# Patient Record
Sex: Female | Born: 1970
Health system: Southern US, Community
[De-identification: ages and names within clinical notes are randomized; demographics above are authoritative.]

## PROBLEM LIST (undated history)

## (undated) DIAGNOSIS — R519 Headache, unspecified: Secondary | ICD-10-CM

## (undated) DIAGNOSIS — R112 Nausea with vomiting, unspecified: Secondary | ICD-10-CM

## (undated) DIAGNOSIS — IMO0002 Reserved for concepts with insufficient information to code with codable children: Secondary | ICD-10-CM

## (undated) DIAGNOSIS — R87619 Unspecified abnormal cytological findings in specimens from cervix uteri: Secondary | ICD-10-CM

## (undated) DIAGNOSIS — A609 Anogenital herpesviral infection, unspecified: Secondary | ICD-10-CM

## (undated) DIAGNOSIS — I10 Essential (primary) hypertension: Secondary | ICD-10-CM

## (undated) DIAGNOSIS — O24419 Gestational diabetes mellitus in pregnancy, unspecified control: Secondary | ICD-10-CM

## (undated) DIAGNOSIS — Z9889 Other specified postprocedural states: Secondary | ICD-10-CM

## (undated) DIAGNOSIS — O09529 Supervision of elderly multigravida, unspecified trimester: Secondary | ICD-10-CM

## (undated) DIAGNOSIS — F419 Anxiety disorder, unspecified: Secondary | ICD-10-CM

## (undated) DIAGNOSIS — Z8619 Personal history of other infectious and parasitic diseases: Secondary | ICD-10-CM

## (undated) DIAGNOSIS — F32A Depression, unspecified: Secondary | ICD-10-CM

## (undated) HISTORY — DX: Anxiety disorder, unspecified: F41.9

## (undated) HISTORY — DX: Reserved for concepts with insufficient information to code with codable children: IMO0002

## (undated) HISTORY — DX: Gestational diabetes mellitus in pregnancy, unspecified control: O24.419

## (undated) HISTORY — PX: LEEP: SHX91

## (undated) HISTORY — DX: Unspecified abnormal cytological findings in specimens from cervix uteri: R87.619

## (undated) HISTORY — DX: Nausea with vomiting, unspecified: R11.2

## (undated) HISTORY — DX: Anogenital herpesviral infection, unspecified: A60.9

## (undated) HISTORY — DX: Other specified postprocedural states: Z98.890

## (undated) HISTORY — PX: HERNIA REPAIR: SHX51

## (undated) HISTORY — DX: Personal history of other infectious and parasitic diseases: Z86.19

## (undated) HISTORY — DX: Supervision of elderly multigravida, unspecified trimester: O09.529

---
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
## 2011-06-28 ENCOUNTER — Other Ambulatory Visit (HOSPITAL_COMMUNITY): Payer: Self-pay | Admitting: Gynecology

## 2011-06-28 DIAGNOSIS — Z369 Encounter for antenatal screening, unspecified: Secondary | ICD-10-CM

## 2011-07-09 ENCOUNTER — Other Ambulatory Visit (HOSPITAL_COMMUNITY): Payer: Self-pay | Admitting: Gynecology

## 2011-07-09 ENCOUNTER — Ambulatory Visit (HOSPITAL_COMMUNITY)
Admission: RE | Admit: 2011-07-09 | Discharge: 2011-07-09 | Disposition: A | Discharge status: Home / Self Care | Payer: 59 | Source: Ambulatory Visit | Attending: Gynecology | Admitting: Gynecology

## 2011-07-09 DIAGNOSIS — Z36 Encounter for antenatal screening of mother: Secondary | ICD-10-CM | POA: Insufficient documentation

## 2011-07-09 DIAGNOSIS — Z369 Encounter for antenatal screening, unspecified: Secondary | ICD-10-CM

## 2011-07-20 LAB — OB RESULTS CONSOLE ABO/RH

## 2011-07-20 LAB — OB RESULTS CONSOLE ANTIBODY SCREEN: Antibody Screen: NEGATIVE

## 2011-07-20 LAB — OB RESULTS CONSOLE HEPATITIS B SURFACE ANTIGEN: Hepatitis B Surface Ag: NEGATIVE

## 2011-07-20 LAB — OB RESULTS CONSOLE GC/CHLAMYDIA: Gonorrhea: NEGATIVE

## 2011-07-20 LAB — OB RESULTS CONSOLE RUBELLA ANTIBODY, IGM: Rubella: IMMUNE

## 2011-09-20 ENCOUNTER — Other Ambulatory Visit (HOSPITAL_COMMUNITY): Payer: Self-pay | Admitting: General Surgery

## 2011-09-20 ENCOUNTER — Ambulatory Visit (HOSPITAL_COMMUNITY)
Admission: RE | Admit: 2011-09-20 | Discharge: 2011-09-20 | Disposition: A | Discharge status: Home / Self Care | Payer: Self-pay | Source: Ambulatory Visit | Attending: General Surgery | Admitting: General Surgery

## 2011-09-20 DIAGNOSIS — R42 Dizziness and giddiness: Secondary | ICD-10-CM | POA: Insufficient documentation

## 2011-09-20 DIAGNOSIS — O99891 Other specified diseases and conditions complicating pregnancy: Secondary | ICD-10-CM | POA: Insufficient documentation

## 2011-09-20 DIAGNOSIS — G459 Transient cerebral ischemic attack, unspecified: Secondary | ICD-10-CM

## 2011-09-20 DIAGNOSIS — Z3490 Encounter for supervision of normal pregnancy, unspecified, unspecified trimester: Secondary | ICD-10-CM

## 2011-12-08 ENCOUNTER — Encounter: Payer: 59 | Attending: Obstetrics and Gynecology | Admitting: *Deleted

## 2011-12-08 VITALS — Ht 63.0 in | Wt 129.6 lb

## 2011-12-08 DIAGNOSIS — Z713 Dietary counseling and surveillance: Secondary | ICD-10-CM | POA: Insufficient documentation

## 2011-12-08 DIAGNOSIS — O99810 Abnormal glucose complicating pregnancy: Secondary | ICD-10-CM | POA: Insufficient documentation

## 2011-12-09 ENCOUNTER — Encounter: Payer: Self-pay | Admitting: *Deleted

## 2011-12-09 NOTE — Progress Notes (Signed)
  Patient was seen on 12/08/11 for Gestational Diabetes self-management class at the [facility]. The following learning objectives were met by the patient during this course:   States the definition of Gestational Diabetes  States why dietary management is important in controlling blood glucose  Describes the effects each nutrient has on blood glucose levels  Demonstrates ability to create a balanced meal plan  Demonstrates carbohydrate counting   States when to check blood glucose levels  Demonstrates proper blood glucose monitoring techniques  States the effect of stress and exercise on blood glucose levels  States the importance of limiting caffeine and abstaining from alcohol and smoking  Blood glucose monitor given:  One Touch Ultra Mini Self Monitoring Kit Lot # [id number] X Exp: 4/14 Blood glucose reading: 55 mg/dl  Patient instructed to monitor glucose levels: FBS: 60 - <90 2 hour: <120  *Patient received handouts:  Nutrition Diabetes and Pregnancy  Carbohydrate Counting List  Patient will be seen for follow-up as needed.

## 2011-12-09 NOTE — Patient Instructions (Signed)
Goals:  Check glucose levels per MD as instructed  Follow Gestational Diabetes Diet as instructed  Call for follow-up as needed    

## 2012-02-23 ENCOUNTER — Encounter (HOSPITAL_COMMUNITY): Payer: Self-pay | Admitting: *Deleted

## 2012-02-23 ENCOUNTER — Telehealth (HOSPITAL_COMMUNITY): Payer: Self-pay | Admitting: *Deleted

## 2012-02-23 NOTE — Telephone Encounter (Signed)
Preadmission screen  

## 2012-02-26 ENCOUNTER — Encounter (HOSPITAL_COMMUNITY): Payer: Self-pay | Admitting: Anesthesiology

## 2012-02-26 ENCOUNTER — Inpatient Hospital Stay (HOSPITAL_COMMUNITY)
Admission: AD | Admit: 2012-02-26 | Discharge: 2012-03-01 | DRG: 765 | Disposition: A | Discharge status: Home / Self Care | Payer: 59 | Source: Ambulatory Visit | Attending: Obstetrics and Gynecology | Admitting: Obstetrics and Gynecology

## 2012-02-26 ENCOUNTER — Encounter (HOSPITAL_COMMUNITY): Payer: Self-pay | Admitting: *Deleted

## 2012-02-26 DIAGNOSIS — O09519 Supervision of elderly primigravida, unspecified trimester: Secondary | ICD-10-CM | POA: Diagnosis present

## 2012-02-26 DIAGNOSIS — E119 Type 2 diabetes mellitus without complications: Secondary | ICD-10-CM | POA: Diagnosis present

## 2012-02-26 DIAGNOSIS — IMO0002 Reserved for concepts with insufficient information to code with codable children: Secondary | ICD-10-CM | POA: Diagnosis not present

## 2012-02-26 DIAGNOSIS — O324XX0 Maternal care for high head at term, not applicable or unspecified: Secondary | ICD-10-CM | POA: Diagnosis present

## 2012-02-26 DIAGNOSIS — O2432 Unspecified pre-existing diabetes mellitus in childbirth: Secondary | ICD-10-CM | POA: Diagnosis present

## 2012-02-26 LAB — CBC
HCT: 42.6 % (ref 36.0–46.0)
MCHC: 34.7 g/dL (ref 30.0–36.0)
MCHC: 34 g/dL (ref 30.0–36.0)
MCV: 91.6 fL (ref 78.0–100.0)
MCV: 93.1 fL (ref 78.0–100.0)
Platelets: 167 10*3/uL (ref 150–400)
RDW: 13.7 % (ref 11.5–15.5)
RDW: 14 % (ref 11.5–15.5)
WBC: 19.3 10*3/uL — ABNORMAL HIGH (ref 4.0–10.5)

## 2012-02-26 LAB — COMPREHENSIVE METABOLIC PANEL
AST: 68 U/L — ABNORMAL HIGH (ref 0–37)
Albumin: 2.5 g/dL — ABNORMAL LOW (ref 3.5–5.2)
Chloride: 101 mEq/L (ref 96–112)
Creatinine, Ser: 1.03 mg/dL (ref 0.50–1.10)
Total Bilirubin: 0.6 mg/dL (ref 0.3–1.2)
Total Protein: 5.6 g/dL — ABNORMAL LOW (ref 6.0–8.3)

## 2012-02-26 LAB — URINALYSIS, DIPSTICK ONLY
Bilirubin Urine: NEGATIVE
Glucose, UA: NEGATIVE mg/dL
Ketones, ur: NEGATIVE mg/dL
Nitrite: NEGATIVE
Protein, ur: 100 mg/dL — AB
Specific Gravity, Urine: 1.02 (ref 1.005–1.030)
Urobilinogen, UA: 0.2 mg/dL (ref 0.0–1.0)
pH: 6 (ref 5.0–8.0)

## 2012-02-26 LAB — GLUCOSE, CAPILLARY: Glucose-Capillary: 66 mg/dL — ABNORMAL LOW (ref 70–99)

## 2012-02-26 LAB — LACTATE DEHYDROGENASE: LDH: 234 U/L (ref 94–250)

## 2012-02-26 MED ORDER — PHENYLEPHRINE 40 MCG/ML (10ML) SYRINGE FOR IV PUSH (FOR BLOOD PRESSURE SUPPORT): 80.0000 ug | PREFILLED_SYRINGE | INTRAVENOUS | Status: DC | PRN

## 2012-02-26 MED ORDER — FLEET ENEMA 7-19 GM/118ML RE ENEM: 1.0000 | ENEMA | RECTAL | Status: DC | PRN

## 2012-02-26 MED ORDER — ACETAMINOPHEN 325 MG PO TABS: 650.0000 mg | ORAL_TABLET | ORAL | Status: DC | PRN

## 2012-02-26 MED ORDER — EPHEDRINE 5 MG/ML INJ: 10.0000 mg | INTRAVENOUS | Status: DC | PRN

## 2012-02-26 MED ORDER — OXYTOCIN 40 UNITS IN LACTATED RINGERS INFUSION - SIMPLE MED: 62.5000 mL/h | INTRAVENOUS | Status: DC

## 2012-02-26 MED ORDER — LACTATED RINGERS IV SOLN: 500.0000 mL | INTRAVENOUS | Status: DC | PRN

## 2012-02-26 MED ORDER — DIPHENHYDRAMINE HCL 50 MG/ML IJ SOLN: 12.5000 mg | INTRAMUSCULAR | Status: DC | PRN

## 2012-02-26 MED ORDER — IBUPROFEN 600 MG PO TABS: 600.0000 mg | ORAL_TABLET | Freq: Four times a day (QID) | ORAL | Status: DC | PRN

## 2012-02-26 MED ORDER — CITRIC ACID-SODIUM CITRATE 334-500 MG/5ML PO SOLN
30.0000 mL | ORAL | Status: DC | PRN
  Administered 2012-02-27 (×2): 30 mL via ORAL
  Filled 2012-02-26 (×2): qty 15

## 2012-02-26 MED ORDER — LACTATED RINGERS IV SOLN
500.0000 mL | Freq: Once | INTRAVENOUS | Status: AC
  Administered 2012-02-26: 500 mL via INTRAVENOUS

## 2012-02-26 MED ORDER — OXYCODONE-ACETAMINOPHEN 5-325 MG PO TABS: 1.0000 | ORAL_TABLET | ORAL | Status: DC | PRN

## 2012-02-26 MED ORDER — ONDANSETRON HCL 4 MG/2ML IJ SOLN: 4.0000 mg | Freq: Four times a day (QID) | INTRAMUSCULAR | Status: DC | PRN

## 2012-02-26 MED ORDER — OXYTOCIN 40 UNITS IN LACTATED RINGERS INFUSION - SIMPLE MED
1.0000 m[IU]/min | INTRAVENOUS | Status: DC
  Administered 2012-02-27: 10 m[IU]/min via INTRAVENOUS
  Administered 2012-02-27: 2 m[IU]/min via INTRAVENOUS
  Filled 2012-02-26: qty 1000

## 2012-02-26 MED ORDER — OXYTOCIN BOLUS FROM INFUSION: 500.0000 mL | INTRAVENOUS | Status: DC

## 2012-02-26 MED ORDER — PHENYLEPHRINE 40 MCG/ML (10ML) SYRINGE FOR IV PUSH (FOR BLOOD PRESSURE SUPPORT)
80.0000 ug | PREFILLED_SYRINGE | INTRAVENOUS | Status: DC | PRN
  Filled 2012-02-26: qty 5

## 2012-02-26 MED ORDER — FENTANYL 2.5 MCG/ML BUPIVACAINE 1/10 % EPIDURAL INFUSION (WH - ANES)
14.0000 mL/h | INTRAMUSCULAR | Status: DC
  Administered 2012-02-26 – 2012-02-27 (×4): 14 mL/h via EPIDURAL
  Filled 2012-02-26 (×4): qty 125

## 2012-02-26 MED ORDER — TERBUTALINE SULFATE 1 MG/ML IJ SOLN: 0.2500 mg | Freq: Once | INTRAMUSCULAR | Status: AC | PRN

## 2012-02-26 MED ORDER — EPHEDRINE 5 MG/ML INJ
10.0000 mg | INTRAVENOUS | Status: DC | PRN
  Filled 2012-02-26: qty 4

## 2012-02-26 MED ORDER — LIDOCAINE HCL (PF) 1 % IJ SOLN
INTRAMUSCULAR | Status: DC | PRN
  Administered 2012-02-26: 9 mL
  Administered 2012-02-26: 7 mL

## 2012-02-26 MED ORDER — LIDOCAINE HCL (PF) 1 % IJ SOLN: 30.0000 mL | INTRAMUSCULAR | Status: DC | PRN

## 2012-02-26 MED ORDER — LACTATED RINGERS IV SOLN
INTRAVENOUS | Status: DC
  Administered 2012-02-26: 125 mL/h via INTRAVENOUS
  Administered 2012-02-26 – 2012-02-27 (×2): via INTRAVENOUS
  Administered 2012-02-27: 112 mL/h via INTRAVENOUS

## 2012-02-26 NOTE — Anesthesia Procedure Notes (Signed)
Epidural Patient location during procedure: OB Start time: 02/26/2012 10:54 AM End time: 02/26/2012 10:58 AM  Staffing Anesthesiologist: Sandrea Hughs Performed by: anesthesiologist   Preanesthetic Checklist Completed: patient identified, site marked, surgical consent, pre-op evaluation, timeout performed, IV checked, risks and benefits discussed and monitors and equipment checked  Epidural Patient position: sitting Prep: site prepped and draped and DuraPrep Patient monitoring: continuous pulse ox and blood pressure Approach: midline Injection technique: LOR air  Needle:  Needle type: Tuohy  Needle gauge: 17 G Needle length: 9 cm and 9 Needle insertion depth: 5 cm cm Catheter type: closed end flexible Catheter size: 19 Gauge Catheter at skin depth: 10 cm Test dose: negative  Assessment Events: blood not aspirated, injection not painful, no injection resistance, negative IV test and no paresthesia  Additional Notes Reason for block:procedure for pain

## 2012-02-26 NOTE — Progress Notes (Signed)
Dr Langston Masker called and given report on pt. VE, FHR pattern, contraction pattern, orders received

## 2012-02-26 NOTE — Progress Notes (Signed)
Kniyah Khun is a 41 y.o. G1P0 at [redacted]w[redacted]d by ultrasound admitted for active labor  Subjective:   Objective: BP 153/84  Pulse 69  Temp 98.4 F (36.9 C) (Oral)  Resp 20  Ht 5\' 3"  (1.6 m)  Wt 145 lb (65.772 kg)  BMI 25.69 kg/m2  SpO2 99%  LMP 06/04/2011      FHT:  FHR: 135 bpm, variability: moderate,  accelerations:  Present,  decelerations:  Absent UC:   regular, every 2-3 minutes SVE:   Dilation: 5 Effacement (%): 90 Station: 0 Exam by:: Dr. Langston Masker  Labs: Lab Results  Component Value Date   WBC 13.9* 02/26/2012   HGB 14.8 02/26/2012   HCT 42.6 02/26/2012   MCV 91.6 02/26/2012   PLT 181 02/26/2012    Assessment / Plan: Spontaneous labor, progressing normally  Labor: Augmented with AROM Preeclampsia:  n/a Fetal Wellbeing:  Category I Pain Control:  Epidural I/D:  n/a Anticipated MOD:  NSVD  Urania Pearlman 02/26/2012, 8:34 PM

## 2012-02-26 NOTE — Anesthesia Preprocedure Evaluation (Addendum)
Anesthesia Evaluation  Patient identified by MRN, date of birth, ID band Patient awake    Reviewed: Allergy & Precautions, H&P , NPO status , Patient's Chart, lab work & pertinent test results  History of Anesthesia Complications (+) PONV  Airway Mallampati: II TM Distance: >3 FB Neck ROM: full    Dental No notable dental hx.    Pulmonary neg pulmonary ROS,    Pulmonary exam normal       Cardiovascular hypertension (on magnesium for PIH), negative cardio ROS      Neuro/Psych negative neurological ROS     GI/Hepatic negative GI ROS, Neg liver ROS,   Endo/Other  diabetes, Gestational  Renal/GU negative Renal ROS  negative genitourinary   Musculoskeletal negative musculoskeletal ROS (+)   Abdominal Normal abdominal exam  (+)   Peds negative pediatric ROS (+)  Hematology negative hematology ROS (+)   Anesthesia Other Findings   Reproductive/Obstetrics (+) Pregnancy (c/s for arrest of descent)                          Anesthesia Physical Anesthesia Plan  ASA: III and emergent  Anesthesia Plan: Epidural   Post-op Pain Management:    Induction:   Airway Management Planned:   Additional Equipment:   Intra-op Plan:   Post-operative Plan:   Informed Consent: I have reviewed the patients History and Physical, chart, labs and discussed the procedure including the risks, benefits and alternatives for the proposed anesthesia with the patient or authorized representative who has indicated his/her understanding and acceptance.     Plan Discussed with: Surgeon and CRNA  Anesthesia Plan Comments:        Anesthesia Quick Evaluation

## 2012-02-26 NOTE — Progress Notes (Signed)
Comfortable with epidural VSS.   Toco: q 5-7 FHT: 130.  Mod var.  +accel.  No decel. SVE: 2/90/-2, AROM, clear 41yo G1 at [redacted]w[redacted]d with labor -Augment with AROM -Add pitocin prn  Mitchel Honour, DO

## 2012-02-26 NOTE — MAU Note (Signed)
Contractions all night which have gotten closer and stronger

## 2012-02-26 NOTE — H&P (Signed)
Donna Phillips is a 41 y.o. female presenting for labor.  CTX started last night and have increased in frequency and intensity.  +FM.  No VB.  No LOF.  Denies HSV prodromal s/sx.  Has been taking Acyclovir suppression. Maternal Medical History:  Reason for admission: Reason for admission: contractions.  Contractions: Onset was 13-24 hours ago.   Frequency: regular.   Perceived severity is moderate.    Fetal activity: Perceived fetal activity is normal.   Last perceived fetal movement was within the past hour.    Prenatal complications: no prenatal complications Prenatal Complications - Diabetes: gestational. Diabetes is managed by diet.      OB History    Grav Para Term Preterm Abortions TAB SAB Ect Mult Living   1              Past Medical History  Diagnosis Date  . Diabetes mellitus   . AMA (advanced maternal age) multigravida 35+   . Abnormal Pap smear   . HSV (herpes simplex virus) anogenital infection   . H/O varicella   . Anxiety   . Gestational diabetes     diet controlled  . PONV (postoperative nausea and vomiting)    Past Surgical History  Procedure Date  . Tonsillectomy 1993  . Hernia repair 1974  . Leep    Family History: family history includes Diabetes in her maternal grandfather; Heart attack in her father, paternal grandfather, and paternal grandmother; Heart disease in her maternal grandfather and paternal uncle; and Stroke in her paternal grandfather. Social History:  does not have a smoking history on file. She has never used smokeless tobacco. She reports that she does not drink alcohol or use illicit drugs.   Prenatal Transfer Tool  Maternal Diabetes: Yes:  Diabetes Type:  Diet controlled Genetic Screening: Normal Maternal Ultrasounds/Referrals: Normal Fetal Ultrasounds or other Referrals:  None Maternal Substance Abuse:  No Significant Maternal Medications:  Meds include: Other: Acyclovir Significant Maternal Lab Results:  None Other Comments:   None  ROS  Dilation: 2 Effacement (%): 80 Station: -2 Exam by:: Ginger Layia Walla RN Blood pressure 159/91, pulse 81, temperature 97.7 F (36.5 C), temperature source Oral, resp. rate 20, height 5\' 3"  (1.6 m), weight 145 lb (65.772 kg), last menstrual period 06/04/2011. Maternal Exam:  Uterine Assessment: Contraction strength is moderate.  Contraction frequency is regular.   Abdomen: Patient reports no abdominal tenderness. Fundal height is  c/w dates.   Estimated fetal weight is 7#8.   Fetal presentation: vertex  Introitus: Normal vulva. Normal vagina.  Pelvis: adequate for delivery.   Cervix: Cervix evaluated by digital exam.     Physical Exam  Constitutional: She is oriented to person, place, and time. She appears well-developed and well-nourished.  Neck: Normal range of motion. Neck supple.  GI: Soft. Bowel sounds are normal.  Genitourinary: Vagina normal and uterus normal.  Musculoskeletal: Normal range of motion.  Neurological: She is alert and oriented to person, place, and time.  Skin: Skin is warm and dry.  Psychiatric: She has a normal mood and affect. Her behavior is normal.    Prenatal labs: ABO, Rh: O/Positive/-- (04/23 0000) Antibody: Negative (04/23 0000) Rubella: Immune (04/23 0000) RPR: Nonreactive (04/23 0000)  HBsAg: Negative (04/23 0000)  HIV: Non-reactive (04/23 0000)  GBS: Negative (11/01 0000)   Assessment/Plan: 40yo G1 at [redacted]w[redacted]d with latent labor -Will get comfortable with epidural -Perform SSE for HSV lesions -Augment with pitocin prn -GBS neg -A1DM-will monitor blood sugar q 4 hours  Kinley Ferrentino 02/26/2012, 10:49 AM

## 2012-02-27 ENCOUNTER — Inpatient Hospital Stay (HOSPITAL_COMMUNITY): Payer: 59 | Admitting: Anesthesiology

## 2012-02-27 ENCOUNTER — Encounter (HOSPITAL_COMMUNITY): Payer: Self-pay | Admitting: *Deleted

## 2012-02-27 ENCOUNTER — Encounter (HOSPITAL_COMMUNITY)
Admission: AD | Disposition: A | Discharge status: Home / Self Care | Payer: Self-pay | Source: Ambulatory Visit | Attending: Obstetrics and Gynecology

## 2012-02-27 ENCOUNTER — Encounter (HOSPITAL_COMMUNITY): Payer: Self-pay | Admitting: Anesthesiology

## 2012-02-27 LAB — CBC
HCT: 37.4 % (ref 36.0–46.0)
Hemoglobin: 14.7 g/dL (ref 12.0–15.0)
Hemoglobin: 12.7 g/dL (ref 12.0–15.0)
MCH: 31.9 pg (ref 26.0–34.0)
MCH: 31.2 pg (ref 26.0–34.0)
MCHC: 34.3 g/dL (ref 30.0–36.0)
MCV: 91.9 fL (ref 78.0–100.0)
Platelets: 154 10*3/uL (ref 150–400)
RBC: 4.07 MIL/uL (ref 3.87–5.11)
RDW: 14.2 % (ref 11.5–15.5)
WBC: 24.6 10*3/uL — ABNORMAL HIGH (ref 4.0–10.5)

## 2012-02-27 LAB — URIC ACID: Uric Acid, Serum: 6.4 mg/dL (ref 2.4–7.0)

## 2012-02-27 LAB — COMPREHENSIVE METABOLIC PANEL
ALT: 69 U/L — ABNORMAL HIGH (ref 0–35)
Albumin: 2.4 g/dL — ABNORMAL LOW (ref 3.5–5.2)
Albumin: 2 g/dL — ABNORMAL LOW (ref 3.5–5.2)
Alkaline Phosphatase: 145 U/L — ABNORMAL HIGH (ref 39–117)
BUN: 17 mg/dL (ref 6–23)
BUN: 17 mg/dL (ref 6–23)
Calcium: 8.6 mg/dL (ref 8.4–10.5)
Creatinine, Ser: 1.27 mg/dL — ABNORMAL HIGH (ref 0.50–1.10)
GFR calc Af Amer: 52 mL/min — ABNORMAL LOW (ref >90)
GFR calc Af Amer: 61 mL/min — ABNORMAL LOW (ref >90)
Glucose, Bld: 114 mg/dL — ABNORMAL HIGH (ref 70–99)
Glucose, Bld: 152 mg/dL — ABNORMAL HIGH (ref 70–99)
Potassium: 3.9 mEq/L (ref 3.5–5.1)
Sodium: 130 mEq/L — ABNORMAL LOW (ref 135–145)
Total Protein: 6 g/dL (ref 6.0–8.3)
Total Protein: 5.1 g/dL — ABNORMAL LOW (ref 6.0–8.3)

## 2012-02-27 LAB — GLUCOSE, CAPILLARY: Glucose-Capillary: 69 mg/dL — ABNORMAL LOW (ref 70–99)

## 2012-02-27 SURGERY — Surgical Case
Anesthesia: Epidural | Site: Abdomen | Wound class: Clean Contaminated

## 2012-02-27 MED ORDER — MAGNESIUM SULFATE 40 G IN LACTATED RINGERS - SIMPLE
1.0000 g/h | INTRAVENOUS | Status: AC
  Administered 2012-02-28: 1 g/h via INTRAVENOUS
  Filled 2012-02-27 (×2): qty 500

## 2012-02-27 MED ORDER — SCOPOLAMINE 1 MG/3DAYS TD PT72
MEDICATED_PATCH | TRANSDERMAL | Status: AC
  Administered 2012-02-27: 1.5 mg via TRANSDERMAL
  Filled 2012-02-27: qty 1

## 2012-02-27 MED ORDER — CARBOPROST TROMETHAMINE 250 MCG/ML IM SOLN
INTRAMUSCULAR | Status: DC | PRN
  Administered 2012-02-27: 250 ug via INTRAMUSCULAR

## 2012-02-27 MED ORDER — SODIUM CHLORIDE 0.9 % IJ SOLN: 3.0000 mL | INTRAMUSCULAR | Status: DC | PRN

## 2012-02-27 MED ORDER — SODIUM CHLORIDE 0.9 % IR SOLN
Status: DC | PRN
  Administered 2012-02-27: 1000 mL

## 2012-02-27 MED ORDER — WITCH HAZEL-GLYCERIN EX PADS: 1.0000 "application " | MEDICATED_PAD | CUTANEOUS | Status: DC | PRN

## 2012-02-27 MED ORDER — DIPHENHYDRAMINE HCL 50 MG/ML IJ SOLN: 25.0000 mg | INTRAMUSCULAR | Status: DC | PRN

## 2012-02-27 MED ORDER — OXYTOCIN 10 UNIT/ML IJ SOLN
40.0000 [IU] | INTRAVENOUS | Status: DC | PRN
  Administered 2012-02-27: 40 [IU] via INTRAVENOUS

## 2012-02-27 MED ORDER — KETOROLAC TROMETHAMINE 60 MG/2ML IM SOLN: 60.0000 mg | Freq: Once | INTRAMUSCULAR | Status: AC | PRN

## 2012-02-27 MED ORDER — DIPHENHYDRAMINE HCL 25 MG PO CAPS: 25.0000 mg | ORAL_CAPSULE | Freq: Four times a day (QID) | ORAL | Status: DC | PRN

## 2012-02-27 MED ORDER — DIBUCAINE 1 % RE OINT: 1.0000 "application " | TOPICAL_OINTMENT | RECTAL | Status: DC | PRN

## 2012-02-27 MED ORDER — ONDANSETRON HCL 4 MG/2ML IJ SOLN
INTRAMUSCULAR | Status: DC | PRN
  Administered 2012-02-27: 4 mg via INTRAVENOUS

## 2012-02-27 MED ORDER — SIMETHICONE 80 MG PO CHEW
80.0000 mg | CHEWABLE_TABLET | Freq: Three times a day (TID) | ORAL | Status: DC
  Administered 2012-02-27 – 2012-03-01 (×10): 80 mg via ORAL

## 2012-02-27 MED ORDER — FENTANYL CITRATE 0.05 MG/ML IJ SOLN
INTRAMUSCULAR | Status: AC
  Filled 2012-02-27: qty 2

## 2012-02-27 MED ORDER — EPHEDRINE SULFATE 50 MG/ML IJ SOLN
INTRAMUSCULAR | Status: DC | PRN
  Administered 2012-02-27 (×2): 10 mg via INTRAVENOUS

## 2012-02-27 MED ORDER — MEPERIDINE HCL 25 MG/ML IJ SOLN: 6.2500 mg | INTRAMUSCULAR | Status: DC | PRN

## 2012-02-27 MED ORDER — FENTANYL CITRATE 0.05 MG/ML IJ SOLN
INTRAMUSCULAR | Status: AC
  Administered 2012-02-27: 50 ug via INTRAVENOUS
  Filled 2012-02-27: qty 2

## 2012-02-27 MED ORDER — NALBUPHINE HCL 10 MG/ML IJ SOLN
5.0000 mg | INTRAMUSCULAR | Status: DC | PRN
  Filled 2012-02-27: qty 1

## 2012-02-27 MED ORDER — PRENATAL MULTIVITAMIN CH
1.0000 | ORAL_TABLET | Freq: Every day | ORAL | Status: DC
  Administered 2012-02-28 – 2012-03-01 (×3): 1 via ORAL
  Filled 2012-02-27 (×3): qty 1

## 2012-02-27 MED ORDER — KETOROLAC TROMETHAMINE 30 MG/ML IJ SOLN
30.0000 mg | Freq: Four times a day (QID) | INTRAMUSCULAR | Status: AC | PRN
  Administered 2012-02-27: 30 mg via INTRAVENOUS

## 2012-02-27 MED ORDER — NALOXONE HCL 0.4 MG/ML IJ SOLN: 0.4000 mg | INTRAMUSCULAR | Status: DC | PRN

## 2012-02-27 MED ORDER — IBUPROFEN 600 MG PO TABS
600.0000 mg | ORAL_TABLET | Freq: Four times a day (QID) | ORAL | Status: DC | PRN
  Administered 2012-02-27 – 2012-03-01 (×9): 600 mg via ORAL
  Filled 2012-02-27 (×8): qty 1

## 2012-02-27 MED ORDER — ZOLPIDEM TARTRATE 5 MG PO TABS: 5.0000 mg | ORAL_TABLET | Freq: Every evening | ORAL | Status: DC | PRN

## 2012-02-27 MED ORDER — NALOXONE HCL 1 MG/ML IJ SOLN
1.0000 ug/kg/h | INTRAVENOUS | Status: DC | PRN
  Filled 2012-02-27: qty 2

## 2012-02-27 MED ORDER — ONDANSETRON HCL 4 MG/2ML IJ SOLN: 4.0000 mg | Freq: Three times a day (TID) | INTRAMUSCULAR | Status: DC | PRN

## 2012-02-27 MED ORDER — SENNOSIDES-DOCUSATE SODIUM 8.6-50 MG PO TABS
2.0000 | ORAL_TABLET | Freq: Every day | ORAL | Status: DC
  Administered 2012-02-27 – 2012-02-29 (×3): 2 via ORAL

## 2012-02-27 MED ORDER — LACTATED RINGERS IV SOLN
INTRAVENOUS | Status: DC | PRN
  Administered 2012-02-27 (×2): via INTRAVENOUS

## 2012-02-27 MED ORDER — LACTATED RINGERS IV SOLN
INTRAVENOUS | Status: DC
  Administered 2012-02-28: 11:00:00 via INTRAVENOUS

## 2012-02-27 MED ORDER — OXYTOCIN 40 UNITS IN LACTATED RINGERS INFUSION - SIMPLE MED
62.5000 mL/h | INTRAVENOUS | Status: AC
  Administered 2012-02-27: 62.5 mL/h via INTRAVENOUS
  Filled 2012-02-27: qty 1000

## 2012-02-27 MED ORDER — ACETAMINOPHEN 325 MG PO TABS: 650.0000 mg | ORAL_TABLET | Freq: Four times a day (QID) | ORAL | Status: DC | PRN

## 2012-02-27 MED ORDER — OXYTOCIN 10 UNIT/ML IJ SOLN
INTRAMUSCULAR | Status: AC
  Filled 2012-02-27: qty 4

## 2012-02-27 MED ORDER — MAGNESIUM SULFATE 40 G IN LACTATED RINGERS - SIMPLE
1.0000 g/h | INTRAVENOUS | Status: DC
  Administered 2012-02-27: 1 g/h via INTRAVENOUS
  Filled 2012-02-27: qty 500

## 2012-02-27 MED ORDER — KETOROLAC TROMETHAMINE 30 MG/ML IJ SOLN: 30.0000 mg | Freq: Four times a day (QID) | INTRAMUSCULAR | Status: AC | PRN

## 2012-02-27 MED ORDER — ONDANSETRON HCL 4 MG/2ML IJ SOLN: 4.0000 mg | INTRAMUSCULAR | Status: DC | PRN

## 2012-02-27 MED ORDER — DIPHENHYDRAMINE HCL 25 MG PO CAPS
25.0000 mg | ORAL_CAPSULE | ORAL | Status: DC | PRN
  Filled 2012-02-27: qty 1

## 2012-02-27 MED ORDER — MAGNESIUM SULFATE BOLUS VIA INFUSION
4.0000 g | Freq: Once | INTRAVENOUS | Status: AC
  Administered 2012-02-27: 4 g via INTRAVENOUS
  Filled 2012-02-27: qty 500

## 2012-02-27 MED ORDER — HYDROMORPHONE HCL 2 MG PO TABS
2.0000 mg | ORAL_TABLET | ORAL | Status: DC | PRN
  Administered 2012-02-28: 2 mg via ORAL
  Filled 2012-02-27: qty 1

## 2012-02-27 MED ORDER — LACTATED RINGERS IV SOLN
INTRAVENOUS | Status: DC | PRN
  Administered 2012-02-27: 12:00:00 via INTRAVENOUS

## 2012-02-27 MED ORDER — LIDOCAINE-EPINEPHRINE (PF) 2 %-1:200000 IJ SOLN
INTRAMUSCULAR | Status: AC
  Filled 2012-02-27: qty 20

## 2012-02-27 MED ORDER — TETANUS-DIPHTH-ACELL PERTUSSIS 5-2.5-18.5 LF-MCG/0.5 IM SUSP
0.5000 mL | Freq: Once | INTRAMUSCULAR | Status: DC
  Filled 2012-02-27: qty 0.5

## 2012-02-27 MED ORDER — MORPHINE SULFATE (PF) 0.5 MG/ML IJ SOLN
INTRAMUSCULAR | Status: DC | PRN
  Administered 2012-02-27: 3000 ug via EPIDURAL

## 2012-02-27 MED ORDER — FENTANYL CITRATE 0.05 MG/ML IJ SOLN
25.0000 ug | INTRAMUSCULAR | Status: DC | PRN
  Administered 2012-02-27 (×2): 50 ug via INTRAVENOUS

## 2012-02-27 MED ORDER — ONDANSETRON HCL 4 MG/2ML IJ SOLN
INTRAMUSCULAR | Status: AC
  Filled 2012-02-27: qty 2

## 2012-02-27 MED ORDER — EPHEDRINE 5 MG/ML INJ
INTRAVENOUS | Status: AC
  Filled 2012-02-27: qty 10

## 2012-02-27 MED ORDER — SODIUM BICARBONATE 8.4 % IV SOLN
INTRAVENOUS | Status: AC
  Filled 2012-02-27: qty 50

## 2012-02-27 MED ORDER — DIPHENHYDRAMINE HCL 50 MG/ML IJ SOLN: 12.5000 mg | INTRAMUSCULAR | Status: DC | PRN

## 2012-02-27 MED ORDER — METOCLOPRAMIDE HCL 5 MG/ML IJ SOLN: 10.0000 mg | Freq: Three times a day (TID) | INTRAMUSCULAR | Status: DC | PRN

## 2012-02-27 MED ORDER — LIDOCAINE-EPINEPHRINE 2 %-1:100000 IJ SOLN
INTRAMUSCULAR | Status: DC | PRN
  Administered 2012-02-27: 6 mL

## 2012-02-27 MED ORDER — FENTANYL CITRATE 0.05 MG/ML IJ SOLN
INTRAMUSCULAR | Status: DC | PRN
  Administered 2012-02-27 (×4): 25 ug via INTRAVENOUS

## 2012-02-27 MED ORDER — SIMETHICONE 80 MG PO CHEW: 80.0000 mg | CHEWABLE_TABLET | ORAL | Status: DC | PRN

## 2012-02-27 MED ORDER — SODIUM BICARBONATE 8.4 % IV SOLN
INTRAVENOUS | Status: DC | PRN
  Administered 2012-02-27: 5 mL via EPIDURAL

## 2012-02-27 MED ORDER — CLINDAMYCIN PHOSPHATE 900 MG/50ML IV SOLN
900.0000 mg | Freq: Once | INTRAVENOUS | Status: AC
  Administered 2012-02-27: 900 mg via INTRAVENOUS
  Filled 2012-02-27: qty 50

## 2012-02-27 MED ORDER — KETOROLAC TROMETHAMINE 30 MG/ML IJ SOLN
INTRAMUSCULAR | Status: AC
  Administered 2012-02-27: 30 mg via INTRAVENOUS
  Filled 2012-02-27: qty 1

## 2012-02-27 MED ORDER — PHENYLEPHRINE HCL 10 MG/ML IJ SOLN
INTRAMUSCULAR | Status: DC | PRN
  Administered 2012-02-27: 40 ug via INTRAVENOUS
  Administered 2012-02-27: 80 ug via INTRAVENOUS
  Administered 2012-02-27 (×2): 40 ug via INTRAVENOUS

## 2012-02-27 MED ORDER — CARBOPROST TROMETHAMINE 250 MCG/ML IM SOLN
INTRAMUSCULAR | Status: AC
  Filled 2012-02-27: qty 1

## 2012-02-27 MED ORDER — PHENYLEPHRINE 40 MCG/ML (10ML) SYRINGE FOR IV PUSH (FOR BLOOD PRESSURE SUPPORT)
PREFILLED_SYRINGE | INTRAVENOUS | Status: AC
  Filled 2012-02-27: qty 5

## 2012-02-27 MED ORDER — SCOPOLAMINE 1 MG/3DAYS TD PT72
1.0000 | MEDICATED_PATCH | Freq: Once | TRANSDERMAL | Status: AC
  Administered 2012-02-27: 1.5 mg via TRANSDERMAL

## 2012-02-27 MED ORDER — MORPHINE SULFATE 0.5 MG/ML IJ SOLN
INTRAMUSCULAR | Status: AC
  Filled 2012-02-27: qty 10

## 2012-02-27 MED ORDER — MENTHOL 3 MG MT LOZG: 1.0000 | LOZENGE | OROMUCOSAL | Status: DC | PRN

## 2012-02-27 MED ORDER — ONDANSETRON HCL 4 MG PO TABS: 4.0000 mg | ORAL_TABLET | ORAL | Status: DC | PRN

## 2012-02-27 MED ORDER — LANOLIN HYDROUS EX OINT: 1.0000 "application " | TOPICAL_OINTMENT | CUTANEOUS | Status: DC | PRN

## 2012-02-27 SURGICAL SUPPLY — 31 items
CLOTH BEACON ORANGE TIMEOUT ST (SAFETY) ×2 IMPLANT
DERMABOND ADVANCED (GAUZE/BANDAGES/DRESSINGS)
DERMABOND ADVANCED .7 DNX12 (GAUZE/BANDAGES/DRESSINGS) IMPLANT
DRSG COVADERM 4X10 (GAUZE/BANDAGES/DRESSINGS) ×2 IMPLANT
DURAPREP 26ML APPLICATOR (WOUND CARE) ×2 IMPLANT
ELECT REM PT RETURN 9FT ADLT (ELECTROSURGICAL) ×2
ELECTRODE REM PT RTRN 9FT ADLT (ELECTROSURGICAL) ×1 IMPLANT
EXTRACTOR VACUUM M CUP 4 TUBE (SUCTIONS) IMPLANT
GLOVE BIO SURGEON STRL SZ 6 (GLOVE) ×2 IMPLANT
GLOVE BIOGEL PI IND STRL 6 (GLOVE) ×2 IMPLANT
GLOVE BIOGEL PI INDICATOR 6 (GLOVE) ×2
GOWN PREVENTION PLUS LG XLONG (DISPOSABLE) ×2 IMPLANT
GOWN STRL REIN XL XLG (GOWN DISPOSABLE) ×2 IMPLANT
KIT ABG SYR 3ML LUER SLIP (SYRINGE) ×2 IMPLANT
NEEDLE HYPO 25X5/8 SAFETYGLIDE (NEEDLE) IMPLANT
NS IRRIG 1000ML POUR BTL (IV SOLUTION) ×2 IMPLANT
PACK C SECTION WH (CUSTOM PROCEDURE TRAY) ×2 IMPLANT
PAD ABD 7.5X8 STRL (GAUZE/BANDAGES/DRESSINGS) ×2 IMPLANT
PAD OB MATERNITY 4.3X12.25 (PERSONAL CARE ITEMS) IMPLANT
SLEEVE SCD COMPRESS KNEE MED (MISCELLANEOUS) IMPLANT
STAPLER VISISTAT 35W (STAPLE) ×2 IMPLANT
SUT CHROMIC 0 CTX 36 (SUTURE) ×6 IMPLANT
SUT MON AB 2-0 CT1 36 (SUTURE) ×2 IMPLANT
SUT PDS AB 0 CT1 27 (SUTURE) IMPLANT
SUT PLAIN 0 NONE (SUTURE) IMPLANT
SUT VIC AB 0 CT1 36 (SUTURE) ×2 IMPLANT
SUT VIC AB 4-0 KS 27 (SUTURE) ×2 IMPLANT
TAPE CLOTH SURG 4X10 WHT LF (GAUZE/BANDAGES/DRESSINGS) ×2 IMPLANT
TOWEL OR 17X24 6PK STRL BLUE (TOWEL DISPOSABLE) ×4 IMPLANT
TRAY FOLEY CATH 14FR (SET/KITS/TRAYS/PACK) IMPLANT
WATER STERILE IRR 1000ML POUR (IV SOLUTION) ×2 IMPLANT

## 2012-02-27 NOTE — Progress Notes (Signed)
Pt nursing baby.

## 2012-02-27 NOTE — Consult Note (Signed)
Neonatology Note:   Attendance at C-section:    I was asked to attend this primary C/S at term due to failure of descent. The mother is a G1P0 O pos, GBS neg with diet-controlled GDM. She has a history of HSV and has been on Acyclovir suppression, without symptoms.  ROM 23 hours prior to delivery, fluid clear. Infant with good tone but never cried vigorously. Needed bulb suctioning. By 3 minutes of age, the baby was having a little grunting, and there were rales on auscultation, so we did chest PT with improvement. Ap 8/9. I told her nurse to allow 10 minutes of time with parents in the OR, then to take her to central nursery (sooner if in any distress). To CN to care of Pediatrician.   Deatra James, MD

## 2012-02-27 NOTE — Anesthesia Postprocedure Evaluation (Signed)
Anesthesia Post Note  Patient: Donna Phillips  Procedure(s) Performed: Procedure(s) (LRB): CESAREAN SECTION (N/A)  Anesthesia type: Epidural  Patient location: PACU  Post pain: Pain level controlled  Post assessment: Post-op Vital signs reviewed  Last Vitals:  Filed Vitals:   02/27/12 1330  BP:   Pulse: 102  Temp:   Resp: 22    Post vital signs: stable  Level of consciousness: awake  Complications: No apparent anesthesia complications

## 2012-02-27 NOTE — Progress Notes (Signed)
Notified anesthesia concerning pain. Will notify CRNA

## 2012-02-27 NOTE — Progress Notes (Signed)
Pt has been pushing approximately 1.5 hours.  Fetal station at +2 with minimal descent and maternal exhaustion.  Patient offered C/S for arrest of descent but declines at this time.  She would like to continue to push and is counseled that the longer the effort with no movement, the less likely a vaginal delivery.  FHT continues to be Category I.  Will continue to push for now and C/S if requested or if no progress in one hour.    Mitchel Honour, DO

## 2012-02-27 NOTE — OR Nursing (Addendum)
Uterus massaged by S. Shown Dissinger [employer]ity fundraiser. Two tubes of cord blood sent to lab. Foley catheter in upon arrival to OR. Urine color-bloody.  40cc of blood evacuated from uterus during uterine massage.

## 2012-02-27 NOTE — Preoperative (Signed)
Antibiotic administered by L&D

## 2012-02-27 NOTE — Progress Notes (Signed)
CRNA in room

## 2012-02-27 NOTE — Progress Notes (Signed)
Pt has continued to push without descent.  Counseled for primary c/s secondary to arrest of descent.  She is informed of risk of bleeding, transfusion and life-saving c-hyst.  She understands the risk of damage to surrounding structures including the bowel, bladder, and fetus.  Also, she is informed of risk of infection and effects in future pregnancies.  All questions were answered and she desires to proceed.  Will continue MagSO4.

## 2012-02-27 NOTE — Transfer of Care (Signed)
Immediate Anesthesia Transfer of Care Note  Patient: Donna Phillips  Procedure(s) Performed: Procedure(s) (LRB) with comments: CESAREAN SECTION (N/A) - Primary cesarean section with delivery of baby  girl at 81.  Apgars9/9.  Patient Location: PACU  Anesthesia Type:Epidural  Level of Consciousness: sedated  Airway & Oxygen Therapy: Patient Spontanous Breathing  Post-op Assessment: Report given to PACU RN  Post vital signs: Reviewed and stable  Complications: No apparent anesthesia complications

## 2012-02-27 NOTE — Anesthesia Postprocedure Evaluation (Signed)
  Anesthesia Post-op Note  Patient: Donna Phillips  Procedure(s) Performed: Procedure(s) (LRB) with comments: CESAREAN SECTION (N/A) - Primary cesarean section with delivery of baby  girl at 76.  Apgars9/9.  Patient Location: PACU and A-ICU  Anesthesia Type:Epidural  Level of Consciousness: awake, alert  and oriented  Airway and Oxygen Therapy: Patient Spontanous Breathing    Post-op Assessment: Patient's Cardiovascular Status Stable and Respiratory Function Stable  Post-op Vital Signs: stable  Complications: No apparent anesthesia complications

## 2012-02-27 NOTE — Addendum Note (Signed)
Addendum  created 02/27/12 2018 by Len Blalock, CRNA   Modules edited:Charges VN, Notes Section

## 2012-02-27 NOTE — Op Note (Signed)
[name] PROCEDURE DATE: 02/26/2012 - 02/27/2012  PREOPERATIVE DIAGNOSIS: 1. Intrauterine pregnancy at [redacted]w[redacted]d weeks gestation 2. Arrest of descent 3. PreEclampsia 4. A1DM  POSTOPERATIVE DIAGNOSIS: The same  PROCEDURE:    Low Transverse Cesarean Section  SURGEON:  Dr. Mitchel Honour  INDICATIONS: Donna Phillips is a 41 y.o. G1P0 at [redacted]w[redacted]d scheduled for cesarean section secondary arrest of descent.  The risks of cesarean section discussed with the patient included but were not limited to: bleeding which may require transfusion or reoperation; infection which may require antibiotics; injury to bowel, bladder, ureters or other surrounding organs; injury to the fetus; need for additional procedures including hysterectomy in the event of a life-threatening hemorrhage; placental abnormalities wth subsequent pregnancies, incisional problems, thromboembolic phenomenon and other postoperative/anesthesia complications. The patient concurred with the proposed plan, giving informed written consent for the procedure.    FINDINGS:  Viable female infant in OP presentation, APGARs 8, 9: weight 7#9.5oz.; clear amniotic fluid.  Intact placenta, three vessel cord.  Grossly normal uterus, ovaries and fallopian tubes.  Uterine atony following delivery requiring extra pitocin and Hemabate x 1 dose. .   ANESTHESIA:    Epidural ESTIMATED BLOOD LOSS: 1200 ml SPECIMENS: Placenta sent to L&D COMPLICATIONS: None immediate  PROCEDURE IN DETAIL:  The patient received intravenous antibiotics and had sequential compression devices applied to her lower extremities while in the preoperative area.  She was then taken to the operating room where epidural anesthesia was dosed up to surgical level) and was found to be adequate. She was then placed in a dorsal supine position with a leftward tilt, and prepped and draped in a sterile manner.  A foley catheter was placed into her bladder and attached to constant gravity. Blood tinged urine  was present prior to the beginning of the case.  After an adequate timeout was performed, a Pfannenstiel skin incision was made with scalpel and carried through to the underlying layer of fascia. The fascia was incised in the midline and this incision was extended bilaterally using the Mayo scissors. Kocher clamps were applied to the superior aspect of the fascial incision and the underlying rectus muscles were dissected off bluntly. A similar process was carried out on the inferior aspect of the facial incision. The rectus muscles were separated in the midline bluntly and the peritoneum was entered bluntly.   A bladder flap was created and the bladder protected behind a bladder blade.  A transverse hysterotomy was made with a scalpel and extended bilaterally bluntly. The bladder blade was then removed. The infant was successfully delivered from OP position, and cord was clamped and cut and infant was handed over to awaiting neonatology team. Uterine massage was then administered and the placenta delivered intact with three-vessel cord. The uterus was cleared of clot and debris.  Atony was noted at this time prompting administration of additional pitocin as well as one dose of Hemabate with improvement in tone.  The hysterotomy was closed with #1 Chromic.  A second imbricating suture of #1 Chromic was used to reinforce the incision and aid in hemostasis.  The peritoneum and rectus muscles were noted to be hemostatic and reaproximated by 2-0 monocryl in a running fashion.  The fascia was closed with 0-Vicryl in a running fashion with good restoration of anatomy.  The subcutaneus tissue was copiously irrigated.  The skin was closed with staples.  Pt tolerated the procedure will.  All counts were correct x2.  Pt went to the recovery room in stable condition.

## 2012-02-27 NOTE — Progress Notes (Signed)
Pt has had persistent mild range blood pressures even after made comfortable with epidural.  HELLP labs were obtained with normal values except elevated AST (68) and ALT (73).  UA with 1+ protein.  Currently asymptomatic but because of diagnosis of PreEclampsia, will add Magnesium Sulfate for seizure ppx.  Patient counseled regarding this.    Mitchel Honour, DO

## 2012-02-28 ENCOUNTER — Encounter (HOSPITAL_COMMUNITY): Payer: Self-pay | Admitting: Obstetrics & Gynecology

## 2012-02-28 ENCOUNTER — Inpatient Hospital Stay (HOSPITAL_COMMUNITY): Admission: RE | Admit: 2012-02-28 | Payer: 59 | Source: Ambulatory Visit

## 2012-02-28 LAB — COMPREHENSIVE METABOLIC PANEL
ALT: 36 U/L — ABNORMAL HIGH (ref 0–35)
ALT: 38 U/L — ABNORMAL HIGH (ref 0–35)
ALT: 43 U/L — ABNORMAL HIGH (ref 0–35)
AST: 44 U/L — ABNORMAL HIGH (ref 0–37)
Albumin: 1.9 g/dL — ABNORMAL LOW (ref 3.5–5.2)
Alkaline Phosphatase: 123 U/L — ABNORMAL HIGH (ref 39–117)
Alkaline Phosphatase: 128 U/L — ABNORMAL HIGH (ref 39–117)
BUN: 19 mg/dL (ref 6–23)
CO2: 23 mEq/L (ref 19–32)
CO2: 23 mEq/L (ref 19–32)
CO2: 24 mEq/L (ref 19–32)
Calcium: 8.3 mg/dL — ABNORMAL LOW (ref 8.4–10.5)
Calcium: 8.3 mg/dL — ABNORMAL LOW (ref 8.4–10.5)
Calcium: 8.7 mg/dL (ref 8.4–10.5)
Chloride: 99 mEq/L (ref 96–112)
Creatinine, Ser: 1.12 mg/dL — ABNORMAL HIGH (ref 0.50–1.10)
Creatinine, Ser: 0.99 mg/dL (ref 0.50–1.10)
GFR calc Af Amer: 71 mL/min — ABNORMAL LOW (ref >90)
GFR calc Af Amer: 82 mL/min — ABNORMAL LOW (ref >90)
GFR calc non Af Amer: 61 mL/min — ABNORMAL LOW (ref >90)
GFR calc non Af Amer: 71 mL/min — ABNORMAL LOW (ref >90)
Glucose, Bld: 86 mg/dL (ref 70–99)
Glucose, Bld: 100 mg/dL — ABNORMAL HIGH (ref 70–99)
Glucose, Bld: 81 mg/dL (ref 70–99)
Potassium: 4.1 mEq/L (ref 3.5–5.1)
Potassium: 4.1 mEq/L (ref 3.5–5.1)
Sodium: 129 mEq/L — ABNORMAL LOW (ref 135–145)
Sodium: 131 mEq/L — ABNORMAL LOW (ref 135–145)
Sodium: 132 mEq/L — ABNORMAL LOW (ref 135–145)
Total Protein: 5 g/dL — ABNORMAL LOW (ref 6.0–8.3)
Total Protein: 5 g/dL — ABNORMAL LOW (ref 6.0–8.3)

## 2012-02-28 LAB — CBC
HCT: 33.5 % — ABNORMAL LOW (ref 36.0–46.0)
Hemoglobin: 11 g/dL — ABNORMAL LOW (ref 12.0–15.0)
Hemoglobin: 11.5 g/dL — ABNORMAL LOW (ref 12.0–15.0)
MCH: 31.5 pg (ref 26.0–34.0)
MCHC: 33.5 g/dL (ref 30.0–36.0)
MCHC: 34.3 g/dL (ref 30.0–36.0)
MCV: 91.8 fL (ref 78.0–100.0)
RDW: 14.3 % (ref 11.5–15.5)
RDW: 14.5 % (ref 11.5–15.5)
WBC: 20.7 10*3/uL — ABNORMAL HIGH (ref 4.0–10.5)

## 2012-02-28 MED ORDER — HYDROMORPHONE HCL 2 MG PO TABS
2.0000 mg | ORAL_TABLET | Freq: Four times a day (QID) | ORAL | Status: DC | PRN
  Administered 2012-02-28 – 2012-03-01 (×5): 2 mg via ORAL
  Filled 2012-02-28 (×6): qty 1

## 2012-02-28 NOTE — Plan of Care (Signed)
Problem: Phase I Progression Outcomes Goal: Voiding adequately Outcome: Not Progressing Foley to gravity drainage Goal: OOB as tolerated unless otherwise ordered Outcome: Completed/Met Date Met:  02/28/12 Walked to nurses station and back and tolerated well Goal: IS, TCDB as ordered Outcome: Completed/Met Date Met:  02/28/12 I/S up to 1100 Goal: VS, stable, temp < 100.4 degrees F Outcome: Completed/Met Date Met:  02/28/12 VSS at this time Goal: Initial discharge plan identified Outcome: Completed/Met Date Met:  02/28/12 VSS  Pain controlled Understands self care Knows when to call MD F/U care

## 2012-02-28 NOTE — Plan of Care (Signed)
Problem: Consults Goal: Postpartum Patient Education (See Patient Education module for education specifics.)  Outcome: Completed/Met Date Met:  02/28/12 Most of teaching done

## 2012-02-28 NOTE — Progress Notes (Signed)
Pt. Requesting "something for pain. Reviewed plan with Dr. Henderson Cloud and pharmacy d/t codeine allergy and received orders for dilaudid po (pt. Tolerated morphine in the OR)

## 2012-02-28 NOTE — Progress Notes (Signed)
UR chart review completed.  

## 2012-02-28 NOTE — Progress Notes (Signed)
No HA/vision change/epigastric pain Good pain relief  Blood pressure 118/67, pulse 95, temperature 98.3 F (36.8 C), temperature source Oral, resp. rate 15, height 5\' 3"  (1.6 m), weight 148 lb 3 oz (67.217 kg), last menstrual period 06/04/2011, SpO2 96.00%, unknown if currently breastfeeding.  Lungs CTA Abd soft, dressing C&D, BS + Ext PAS on DTR 2+  Results for orders placed during the hospital encounter of 02/26/12 (from the past 24 hour(s))  CBC     Status: Abnormal   Collection Time   02/27/12  9:40 AM      Component Value Range   WBC 30.0 (*) 4.0 - 10.5 K/uL   RBC 4.61  3.87 - 5.11 MIL/uL   Hemoglobin 14.7  12.0 - 15.0 g/dL   HCT 40.9  81.1 - 91.4 %   MCV 92.8  78.0 - 100.0 fL   MCH 31.9  26.0 - 34.0 pg   MCHC 34.3  30.0 - 36.0 g/dL   RDW 78.2  95.6 - 21.3 %   Platelets 165  150 - 400 K/uL  COMPREHENSIVE METABOLIC PANEL     Status: Abnormal   Collection Time   02/27/12  9:40 AM      Component Value Range   Sodium 130 (*) 135 - 145 mEq/L   Potassium 4.1  3.5 - 5.1 mEq/L   Chloride 98  96 - 112 mEq/L   CO2 15 (*) 19 - 32 mEq/L   Glucose, Bld 114 (*) 70 - 99 mg/dL   BUN 17  6 - 23 mg/dL   Creatinine, Ser 0.86 (*) 0.50 - 1.10 mg/dL   Calcium 8.6  8.4 - 57.8 mg/dL   Total Protein 6.0  6.0 - 8.3 g/dL   Albumin 2.4 (*) 3.5 - 5.2 g/dL   AST 61 (*) 0 - 37 U/L   ALT 69 (*) 0 - 35 U/L   Alkaline Phosphatase 179 (*) 39 - 117 U/L   Total Bilirubin 0.5  0.3 - 1.2 mg/dL   GFR calc non Af Amer 45 (*) >90 mL/min   GFR calc Af Amer 52 (*) >90 mL/min  URIC ACID     Status: Normal   Collection Time   02/27/12  9:40 AM      Component Value Range   Uric Acid, Serum 6.4  2.4 - 7.0 mg/dL  LACTATE DEHYDROGENASE     Status: Abnormal   Collection Time   02/27/12  9:40 AM      Component Value Range   LDH 252 (*) 94 - 250 U/L  GLUCOSE, CAPILLARY     Status: Abnormal   Collection Time   02/27/12  1:18 PM      Component Value Range   Glucose-Capillary 121 (*) 70 - 99 mg/dL  CBC      Status: Abnormal   Collection Time   02/27/12  3:10 PM      Component Value Range   WBC 24.6 (*) 4.0 - 10.5 K/uL   RBC 4.07  3.87 - 5.11 MIL/uL   Hemoglobin 12.7  12.0 - 15.0 g/dL   HCT 46.9  62.9 - 52.8 %   MCV 91.9  78.0 - 100.0 fL   MCH 31.2  26.0 - 34.0 pg   MCHC 34.0  30.0 - 36.0 g/dL   RDW 41.3  24.4 - 01.0 %   Platelets 154  150 - 400 K/uL  COMPREHENSIVE METABOLIC PANEL     Status: Abnormal   Collection Time   02/27/12  3:10  PM      Component Value Range   Sodium 132 (*) 135 - 145 mEq/L   Potassium 3.9  3.5 - 5.1 mEq/L   Chloride 101  96 - 112 mEq/L   CO2 19  19 - 32 mEq/L   Glucose, Bld 152 (*) 70 - 99 mg/dL   BUN 17  6 - 23 mg/dL   Creatinine, Ser 1.61 (*) 0.50 - 1.10 mg/dL   Calcium 8.2 (*) 8.4 - 10.5 mg/dL   Total Protein 5.1 (*) 6.0 - 8.3 g/dL   Albumin 2.0 (*) 3.5 - 5.2 g/dL   AST 53 (*) 0 - 37 U/L   ALT 52 (*) 0 - 35 U/L   Alkaline Phosphatase 145 (*) 39 - 117 U/L   Total Bilirubin 0.5  0.3 - 1.2 mg/dL   GFR calc non Af Amer 52 (*) >90 mL/min   GFR calc Af Amer 61 (*) >90 mL/min  CBC     Status: Abnormal   Collection Time   02/28/12 12:15 AM      Component Value Range   WBC 25.6 (*) 4.0 - 10.5 K/uL   RBC 3.65 (*) 3.87 - 5.11 MIL/uL   Hemoglobin 11.5 (*) 12.0 - 15.0 g/dL   HCT 09.6 (*) 04.5 - 40.9 %   MCV 91.8  78.0 - 100.0 fL   MCH 31.5  26.0 - 34.0 pg   MCHC 34.3  30.0 - 36.0 g/dL   RDW 81.1  91.4 - 78.2 %   Platelets 164  150 - 400 K/uL  COMPREHENSIVE METABOLIC PANEL     Status: Abnormal   Collection Time   02/28/12 12:15 AM      Component Value Range   Sodium 129 (*) 135 - 145 mEq/L   Potassium 4.1  3.5 - 5.1 mEq/L   Chloride 99  96 - 112 mEq/L   CO2 23  19 - 32 mEq/L   Glucose, Bld 114 (*) 70 - 99 mg/dL   BUN 20  6 - 23 mg/dL   Creatinine, Ser 9.56 (*) 0.50 - 1.10 mg/dL   Calcium 8.3 (*) 8.4 - 10.5 mg/dL   Total Protein 5.0 (*) 6.0 - 8.3 g/dL   Albumin 2.0 (*) 3.5 - 5.2 g/dL   AST 44 (*) 0 - 37 U/L   ALT 43 (*) 0 - 35 U/L   Alkaline  Phosphatase 128 (*) 39 - 117 U/L   Total Bilirubin 0.4  0.3 - 1.2 mg/dL   GFR calc non Af Amer 52 (*) >90 mL/min   GFR calc Af Amer 61 (*) >90 mL/min  COMPREHENSIVE METABOLIC PANEL     Status: Abnormal   Collection Time   02/28/12  6:31 AM      Component Value Range   Sodium 131 (*) 135 - 145 mEq/L   Potassium 4.1  3.5 - 5.1 mEq/L   Chloride 100  96 - 112 mEq/L   CO2 23  19 - 32 mEq/L   Glucose, Bld 86  70 - 99 mg/dL   BUN 19  6 - 23 mg/dL   Creatinine, Ser 2.13 (*) 0.50 - 1.10 mg/dL   Calcium 8.3 (*) 8.4 - 10.5 mg/dL   Total Protein 4.7 (*) 6.0 - 8.3 g/dL   Albumin 1.8 (*) 3.5 - 5.2 g/dL   AST 35  0 - 37 U/L   ALT 38 (*) 0 - 35 U/L   Alkaline Phosphatase 115  39 - 117 U/L   Total Bilirubin  0.4  0.3 - 1.2 mg/dL   GFR calc non Af Amer 61 (*) >90 mL/min   GFR calc Af Amer 71 (*) >90 mL/min  A: Preeclampsia  P: Continue magnesium sulfate      Watch UO      Labs as ordered      D/W patient and husband

## 2012-02-29 LAB — TYPE AND SCREEN
ABO/RH(D): O POS
Antibody Screen: NEGATIVE
Unit division: 0

## 2012-02-29 NOTE — Progress Notes (Signed)
Subjective: Postpartum Day 2: Cesarean Delivery Patient reports incisional pain, tolerating PO, + flatus and no problems voiding.  Denies HA , blurred vision or RUQ pain  Objective: Vital signs in last 24 hours: Temp:  [98.3 F (36.8 C)-98.7 F (37.1 C)] 98.4 F (36.9 C) (12/03 0509) Pulse Rate:  [83-106] 84  (12/03 0509) Resp:  [15-18] 18  (12/03 0509) BP: (111-144)/(61-80) 120/73 mmHg (12/03 0509) SpO2:  [95 %-98 %] 97 % (12/02 2300)  Physical Exam:  General: alert and cooperative Lochia: appropriate Uterine Fundus: firm Incision: healing well, staples intact DVT Evaluation: No evidence of DVT seen on physical exam. Negative Homan's sign. No cords or calf tenderness. No significant calf/ankle edema. DTR's 1+ no clonus   Basename 02/28/12 1235 02/28/12 0015  HGB 11.0* 11.5*  HCT 32.8* 33.5*    Assessment/Plan: Status post Cesarean section. Doing well postoperatively.  Continue current care. Anticipate dc in am  Inza Mikrut G 02/29/2012, 8:15 AM

## 2012-03-01 MED ORDER — HYDROMORPHONE HCL 2 MG PO TABS: 2.0000 mg | ORAL_TABLET | Freq: Four times a day (QID) | ORAL | Status: DC | PRN

## 2012-03-01 MED ORDER — IBUPROFEN 600 MG PO TABS: 600.0000 mg | ORAL_TABLET | Freq: Four times a day (QID) | ORAL | Status: DC | PRN

## 2012-03-01 MED ORDER — CEPHALEXIN 500 MG PO CAPS: 500.0000 mg | ORAL_CAPSULE | Freq: Four times a day (QID) | ORAL | Status: DC

## 2012-03-01 MED ORDER — CEPHALEXIN 500 MG PO CAPS
500.0000 mg | ORAL_CAPSULE | Freq: Four times a day (QID) | ORAL | Status: DC
  Filled 2012-03-01 (×2): qty 1

## 2012-03-01 NOTE — Progress Notes (Signed)
Pt discharged before CSW could assess history of anxiety. 

## 2012-03-01 NOTE — Discharge Summary (Signed)
Obstetric Discharge Summary Reason for Admission: onset of labor Prenatal Procedures: ultrasound Intrapartum Procedures: cesarean: low cervical, transverse Postpartum Procedures: none Complications-Operative and Postpartum: preeclampsia Hemoglobin  Date Value Range Status  02/28/2012 11.0* 12.0 - 15.0 Phillips/dL Final     HCT  Date Value Range Status  02/28/2012 32.8* 36.0 - 46.0 % Final    Physical Exam:  General: alert and cooperative, denies HA, blurred vision or RUQ pain Lochia: appropriate Uterine Fundus: firm Incision: slightly erythematous , staples left in place DVT Evaluation: No evidence of DVT seen on physical exam. Calf/Ankle edema is present. DTR's 1+ no clonus  Discharge Diagnoses: Term Pregnancy-delivered and Preelampsia  Discharge Information: Date: 03/01/2012 Activity: pelvic rest Diet: routine Medications: None, Ibuprofen and dilaudid and Keflex Condition: stable Instructions: refer to practice specific booklet Discharge to: home. RTO 2 days for incision check and BP check. S and S of PIH reviewed with patient and husband   Newborn Data: Live born female  Birth Weight: 7 lb 9.5 oz (3445 Phillips) APGAR: 8, 9  Home with mother.  Donna Phillips 03/01/2012, 8:34 AM

## 2012-09-13 ENCOUNTER — Other Ambulatory Visit (HOSPITAL_COMMUNITY): Payer: Self-pay | Admitting: Internal Medicine

## 2012-09-13 DIAGNOSIS — Z139 Encounter for screening, unspecified: Secondary | ICD-10-CM

## 2012-09-14 ENCOUNTER — Ambulatory Visit (HOSPITAL_COMMUNITY)
Admission: RE | Admit: 2012-09-14 | Discharge: 2012-09-14 | Disposition: A | Discharge status: Home / Self Care | Payer: 59 | Source: Ambulatory Visit | Attending: Internal Medicine | Admitting: Internal Medicine

## 2012-09-14 DIAGNOSIS — Z139 Encounter for screening, unspecified: Secondary | ICD-10-CM

## 2012-09-14 DIAGNOSIS — Z1231 Encounter for screening mammogram for malignant neoplasm of breast: Secondary | ICD-10-CM | POA: Insufficient documentation

## 2013-08-08 ENCOUNTER — Other Ambulatory Visit (HOSPITAL_COMMUNITY): Payer: Self-pay | Admitting: Internal Medicine

## 2013-08-08 DIAGNOSIS — Z1231 Encounter for screening mammogram for malignant neoplasm of breast: Secondary | ICD-10-CM

## 2013-09-17 ENCOUNTER — Ambulatory Visit (HOSPITAL_COMMUNITY)
Admission: RE | Admit: 2013-09-17 | Discharge: 2013-09-17 | Disposition: A | Discharge status: Home / Self Care | Payer: 59 | Source: Ambulatory Visit | Attending: Internal Medicine | Admitting: Internal Medicine

## 2013-09-17 DIAGNOSIS — Z1231 Encounter for screening mammogram for malignant neoplasm of breast: Secondary | ICD-10-CM | POA: Insufficient documentation

## 2014-01-28 ENCOUNTER — Encounter (HOSPITAL_COMMUNITY): Payer: Self-pay | Admitting: Obstetrics & Gynecology

## 2014-09-02 ENCOUNTER — Other Ambulatory Visit (HOSPITAL_COMMUNITY): Payer: Self-pay | Admitting: Internal Medicine

## 2014-09-02 DIAGNOSIS — Z1231 Encounter for screening mammogram for malignant neoplasm of breast: Secondary | ICD-10-CM

## 2014-09-23 ENCOUNTER — Ambulatory Visit (HOSPITAL_COMMUNITY)
Admission: RE | Admit: 2014-09-23 | Discharge: 2014-09-23 | Disposition: A | Discharge status: Home / Self Care | Payer: 59 | Source: Ambulatory Visit | Attending: Internal Medicine | Admitting: Internal Medicine

## 2014-09-23 DIAGNOSIS — Z1231 Encounter for screening mammogram for malignant neoplasm of breast: Secondary | ICD-10-CM | POA: Diagnosis present

## 2014-09-25 ENCOUNTER — Other Ambulatory Visit: Payer: Self-pay | Admitting: Internal Medicine

## 2014-09-25 DIAGNOSIS — R928 Other abnormal and inconclusive findings on diagnostic imaging of breast: Secondary | ICD-10-CM

## 2014-09-26 ENCOUNTER — Other Ambulatory Visit (HOSPITAL_COMMUNITY): Payer: Self-pay | Admitting: Internal Medicine

## 2014-09-26 DIAGNOSIS — R928 Other abnormal and inconclusive findings on diagnostic imaging of breast: Secondary | ICD-10-CM

## 2014-10-15 ENCOUNTER — Encounter (HOSPITAL_COMMUNITY): Payer: 59

## 2014-10-15 ENCOUNTER — Ambulatory Visit (HOSPITAL_COMMUNITY)
Admission: RE | Admit: 2014-10-15 | Discharge: 2014-10-15 | Disposition: A | Discharge status: Home / Self Care | Payer: 59 | Source: Ambulatory Visit | Attending: Internal Medicine | Admitting: Internal Medicine

## 2014-10-15 DIAGNOSIS — R928 Other abnormal and inconclusive findings on diagnostic imaging of breast: Secondary | ICD-10-CM | POA: Diagnosis not present

## 2015-04-30 ENCOUNTER — Other Ambulatory Visit: Payer: Self-pay | Admitting: *Deleted

## 2015-04-30 ENCOUNTER — Ambulatory Visit (HOSPITAL_COMMUNITY)
Admission: RE | Admit: 2015-04-30 | Discharge: 2015-04-30 | Disposition: A | Discharge status: Home / Self Care | Payer: 59 | Source: Ambulatory Visit | Attending: Orthopedic Surgery | Admitting: Orthopedic Surgery

## 2015-04-30 ENCOUNTER — Other Ambulatory Visit: Payer: Self-pay | Admitting: Orthopedic Surgery

## 2015-04-30 DIAGNOSIS — R52 Pain, unspecified: Secondary | ICD-10-CM

## 2015-04-30 DIAGNOSIS — M25531 Pain in right wrist: Secondary | ICD-10-CM | POA: Diagnosis not present

## 2015-05-01 MED FILL — FLUoxetine HCL 20 MG CAPS: 20 | 30 days supply | Qty: 90 | Fill #1

## 2015-05-03 DIAGNOSIS — M25531 Pain in right wrist: Secondary | ICD-10-CM | POA: Diagnosis not present

## 2015-05-03 DIAGNOSIS — M67431 Ganglion, right wrist: Secondary | ICD-10-CM | POA: Diagnosis not present

## 2015-05-05 ENCOUNTER — Telehealth: Payer: Self-pay | Admitting: Orthopedic Surgery

## 2015-05-05 NOTE — Telephone Encounter (Signed)
xrays were normal but call us if any problems come up

## 2015-05-05 NOTE — Telephone Encounter (Signed)
Patient had called in to cancel her scheduled appointment for 05/06/15 for right wrist pain, and had her Xray, ordered by our office, prior to the visit (had it done at [facility] 04/30/15).  Patient states her wrist is better, and does not need to re-schedule at this time.  Please advise patient of Xray results and recommendations, if any.  [phone]

## 2015-05-06 ENCOUNTER — Ambulatory Visit: Payer: 59 | Admitting: Orthopedic Surgery

## 2015-05-06 NOTE — Telephone Encounter (Signed)
Called patient per Dr Ruthe Mannan review and response; left voice message to return call to receive the information.

## 2015-06-03 DIAGNOSIS — Z01419 Encounter for gynecological examination (general) (routine) without abnormal findings: Secondary | ICD-10-CM | POA: Diagnosis not present

## 2015-06-03 DIAGNOSIS — Z6821 Body mass index (BMI) 21.0-21.9, adult: Secondary | ICD-10-CM | POA: Diagnosis not present

## 2015-06-03 MED FILL — LARIN FE 1-20 TABLET: 1-20 | 84 days supply | Qty: 84 | Fill #0

## 2015-06-03 MED FILL — FLUoxetine HCL 10 MG CAPS: 10 | 24 days supply | Qty: 51 | Fill #0

## 2015-06-11 MED FILL — ESCITALOPRAM 20 MG TABLET: 20 | 30 days supply | Qty: 30 | Fill #0

## 2015-07-24 MED FILL — ESCITALOPRAM 20 MG TABLET: 20 | 30 days supply | Qty: 30 | Fill #1

## 2015-08-26 MED FILL — NORETHIN-ESTRAD-FERR 1-0.02: 1-20 | 84 days supply | Qty: 84 | Fill #1

## 2015-09-02 ENCOUNTER — Other Ambulatory Visit (HOSPITAL_COMMUNITY): Payer: Self-pay | Admitting: Internal Medicine

## 2015-09-02 DIAGNOSIS — Z1231 Encounter for screening mammogram for malignant neoplasm of breast: Secondary | ICD-10-CM

## 2015-09-08 MED FILL — ESCITALOPRAM 20 MG TABLET: 20 | 30 days supply | Qty: 30 | Fill #2

## 2015-10-03 DIAGNOSIS — Z6822 Body mass index (BMI) 22.0-22.9, adult: Secondary | ICD-10-CM | POA: Diagnosis not present

## 2015-10-03 DIAGNOSIS — F330 Major depressive disorder, recurrent, mild: Secondary | ICD-10-CM | POA: Diagnosis not present

## 2015-10-29 ENCOUNTER — Ambulatory Visit (HOSPITAL_COMMUNITY): Payer: 59

## 2015-10-31 DIAGNOSIS — F330 Major depressive disorder, recurrent, mild: Secondary | ICD-10-CM | POA: Diagnosis not present

## 2015-10-31 DIAGNOSIS — R05 Cough: Secondary | ICD-10-CM | POA: Diagnosis not present

## 2015-11-03 MED FILL — BUPROPION HCL XL 150 MG TAB: 150 | 30 days supply | Qty: 30 | Fill #0

## 2015-11-06 ENCOUNTER — Ambulatory Visit (HOSPITAL_COMMUNITY): Payer: 59

## 2015-11-12 ENCOUNTER — Ambulatory Visit (HOSPITAL_COMMUNITY): Payer: 59

## 2015-11-12 ENCOUNTER — Encounter (HOSPITAL_COMMUNITY): Payer: Self-pay

## 2015-11-12 ENCOUNTER — Ambulatory Visit (HOSPITAL_COMMUNITY)
Admission: RE | Admit: 2015-11-12 | Discharge: 2015-11-12 | Disposition: A | Discharge status: Home / Self Care | Payer: 59 | Source: Ambulatory Visit | Attending: Internal Medicine | Admitting: Internal Medicine

## 2015-11-12 ENCOUNTER — Other Ambulatory Visit (HOSPITAL_COMMUNITY): Payer: Self-pay | Admitting: Internal Medicine

## 2015-11-12 DIAGNOSIS — Z1231 Encounter for screening mammogram for malignant neoplasm of breast: Secondary | ICD-10-CM | POA: Diagnosis not present

## 2015-11-17 MED FILL — NORETHIN-ESTRAD-FERR 1-0.02: 1-20 | 84 days supply | Qty: 84 | Fill #2

## 2015-11-26 MED FILL — BUPROPION HCL XL 150 MG TAB: 150 | 30 days supply | Qty: 30 | Fill #0

## 2015-12-08 DIAGNOSIS — F341 Dysthymic disorder: Secondary | ICD-10-CM | POA: Diagnosis not present

## 2015-12-10 MED FILL — BUPROPION HCL XL 300 MG TAB: 300 | 30 days supply | Qty: 30 | Fill #0

## 2015-12-24 DIAGNOSIS — F341 Dysthymic disorder: Secondary | ICD-10-CM | POA: Diagnosis not present

## 2015-12-25 DIAGNOSIS — F330 Major depressive disorder, recurrent, mild: Secondary | ICD-10-CM | POA: Diagnosis not present

## 2015-12-25 DIAGNOSIS — Z23 Encounter for immunization: Secondary | ICD-10-CM | POA: Diagnosis not present

## 2016-01-13 MED FILL — BUPROPION HCL XL 300 MG TAB: 300 | 90 days supply | Qty: 90 | Fill #0

## 2016-02-09 MED FILL — NORETHIN-ESTRAD-FERR 1-0.02: 1-20 | 84 days supply | Qty: 84 | Fill #3

## 2016-02-16 ENCOUNTER — Other Ambulatory Visit (HOSPITAL_COMMUNITY)
Admission: RE | Admit: 2016-02-16 | Discharge: 2016-02-16 | Disposition: A | Discharge status: Home / Self Care | Payer: 59 | Source: Ambulatory Visit | Attending: Internal Medicine | Admitting: Internal Medicine

## 2016-02-16 DIAGNOSIS — Z0000 Encounter for general adult medical examination without abnormal findings: Secondary | ICD-10-CM | POA: Insufficient documentation

## 2016-02-16 LAB — URINALYSIS W MICROSCOPIC (NOT AT ARMC)
Bacteria, UA: NONE SEEN
Bilirubin Urine: NEGATIVE
GLUCOSE, UA: NEGATIVE mg/dL
Ketones, ur: NEGATIVE mg/dL
Leukocytes, UA: NEGATIVE
Nitrite: NEGATIVE
PH: 5.5 (ref 5.0–8.0)
Protein, ur: NEGATIVE mg/dL
SQUAMOUS EPITHELIAL / LPF: NONE SEEN
Specific Gravity, Urine: 1.025 (ref 1.005–1.030)
WBC, UA: NONE SEEN WBC/hpf (ref 0–5)

## 2016-02-16 LAB — CBC
HCT: 42.1 % (ref 36.0–46.0)
Hemoglobin: 14 g/dL (ref 12.0–15.0)
MCH: 30.9 pg (ref 26.0–34.0)
MCHC: 33.3 g/dL (ref 30.0–36.0)
MCV: 92.9 fL (ref 78.0–100.0)
PLATELETS: 285 10*3/uL (ref 150–400)
RBC: 4.53 MIL/uL (ref 3.87–5.11)
RDW: 13.1 % (ref 11.5–15.5)
WBC: 9.3 10*3/uL (ref 4.0–10.5)

## 2016-02-16 LAB — COMPREHENSIVE METABOLIC PANEL
ALT: 21 U/L (ref 14–54)
ANION GAP: 6 (ref 5–15)
AST: 24 U/L (ref 15–41)
Albumin: 3.9 g/dL (ref 3.5–5.0)
Alkaline Phosphatase: 56 U/L (ref 38–126)
BUN: 17 mg/dL (ref 6–20)
CHLORIDE: 108 mmol/L (ref 101–111)
CO2: 24 mmol/L (ref 22–32)
Calcium: 8.8 mg/dL — ABNORMAL LOW (ref 8.9–10.3)
Creatinine, Ser: 0.84 mg/dL (ref 0.44–1.00)
GFR calc non Af Amer: >60 mL/min (ref >60)
Glucose, Bld: 109 mg/dL — ABNORMAL HIGH (ref 65–99)
POTASSIUM: 3.8 mmol/L (ref 3.5–5.1)
SODIUM: 138 mmol/L (ref 135–145)
Total Bilirubin: 0.7 mg/dL (ref 0.3–1.2)
Total Protein: 6.8 g/dL (ref 6.5–8.1)

## 2016-02-16 LAB — LIPID PANEL
CHOL/HDL RATIO: 2.3 ratio
Cholesterol: 158 mg/dL (ref 0–200)
HDL: 69 mg/dL (ref >40)
LDL CALC: 81 mg/dL (ref 0–99)
TRIGLYCERIDES: 40 mg/dL (ref <150)
VLDL: 8 mg/dL (ref 0–40)

## 2016-02-26 DIAGNOSIS — Z6820 Body mass index (BMI) 20.0-20.9, adult: Secondary | ICD-10-CM | POA: Diagnosis not present

## 2016-02-26 DIAGNOSIS — R05 Cough: Secondary | ICD-10-CM | POA: Diagnosis not present

## 2016-02-26 DIAGNOSIS — Z0001 Encounter for general adult medical examination with abnormal findings: Secondary | ICD-10-CM | POA: Diagnosis not present

## 2016-02-26 DIAGNOSIS — F330 Major depressive disorder, recurrent, mild: Secondary | ICD-10-CM | POA: Diagnosis not present

## 2016-04-12 MED FILL — BUPROPION HCL XL 300 MG TAB: 300 | 90 days supply | Qty: 90 | Fill #1

## 2016-04-22 DIAGNOSIS — H35462 Secondary vitreoretinal degeneration, left eye: Secondary | ICD-10-CM | POA: Diagnosis not present

## 2016-04-22 DIAGNOSIS — H5213 Myopia, bilateral: Secondary | ICD-10-CM | POA: Diagnosis not present

## 2016-04-22 DIAGNOSIS — H52222 Regular astigmatism, left eye: Secondary | ICD-10-CM | POA: Diagnosis not present

## 2016-05-04 MED FILL — NORETHIN-ESTRAD-FERR 1-0.02: 1-20 | 84 days supply | Qty: 84 | Fill #0

## 2016-06-07 DIAGNOSIS — Z01419 Encounter for gynecological examination (general) (routine) without abnormal findings: Secondary | ICD-10-CM | POA: Diagnosis not present

## 2016-06-07 DIAGNOSIS — Z681 Body mass index (BMI) 19 or less, adult: Secondary | ICD-10-CM | POA: Diagnosis not present

## 2016-06-17 DIAGNOSIS — Z6820 Body mass index (BMI) 20.0-20.9, adult: Secondary | ICD-10-CM | POA: Diagnosis not present

## 2016-06-17 DIAGNOSIS — F339 Major depressive disorder, recurrent, unspecified: Secondary | ICD-10-CM | POA: Diagnosis not present

## 2016-07-12 MED FILL — BUPROPION HCL XL 300 MG TAB: 300 | 90 days supply | Qty: 90 | Fill #2

## 2016-09-06 MED FILL — NORETHIN-ESTRAD-FERR 1-0.02: 1-20 | 84 days supply | Qty: 84 | Fill #0

## 2016-10-04 MED FILL — BUPROPION HCL XL 300 MG TAB: 300 | 90 days supply | Qty: 90 | Fill #3

## 2016-10-19 DIAGNOSIS — F339 Major depressive disorder, recurrent, unspecified: Secondary | ICD-10-CM | POA: Diagnosis not present

## 2016-11-30 MED FILL — NORETHIN-ESTRAD-FERR 1-0.02: 1-20 | 84 days supply | Qty: 84 | Fill #1

## 2016-12-06 ENCOUNTER — Ambulatory Visit (HOSPITAL_COMMUNITY)
Admission: RE | Admit: 2016-12-06 | Discharge: 2016-12-06 | Disposition: A | Discharge status: Home / Self Care | Payer: 59 | Source: Ambulatory Visit | Attending: Internal Medicine | Admitting: Internal Medicine

## 2016-12-06 DIAGNOSIS — Z1231 Encounter for screening mammogram for malignant neoplasm of breast: Secondary | ICD-10-CM | POA: Diagnosis not present

## 2017-01-04 DIAGNOSIS — Z23 Encounter for immunization: Secondary | ICD-10-CM | POA: Diagnosis not present

## 2017-01-05 MED FILL — BUPROPION HCL XL 300 MG TAB: 300 | 90 days supply | Qty: 90 | Fill #0

## 2017-02-23 MED FILL — LARIN FE 1-20 TABLET: 1-20 | 84 days supply | Qty: 84 | Fill #2

## 2017-02-24 DIAGNOSIS — Z0001 Encounter for general adult medical examination with abnormal findings: Secondary | ICD-10-CM | POA: Diagnosis not present

## 2017-03-03 DIAGNOSIS — Z6820 Body mass index (BMI) 20.0-20.9, adult: Secondary | ICD-10-CM | POA: Diagnosis not present

## 2017-03-03 DIAGNOSIS — F3340 Major depressive disorder, recurrent, in remission, unspecified: Secondary | ICD-10-CM | POA: Diagnosis not present

## 2017-03-03 DIAGNOSIS — Z0001 Encounter for general adult medical examination with abnormal findings: Secondary | ICD-10-CM | POA: Diagnosis not present

## 2017-03-03 DIAGNOSIS — R739 Hyperglycemia, unspecified: Secondary | ICD-10-CM | POA: Diagnosis not present

## 2017-04-11 MED FILL — BUPROPION HCL XL 300 MG TAB: 300 | 90 days supply | Qty: 90 | Fill #1

## 2017-05-11 DIAGNOSIS — M67431 Ganglion, right wrist: Secondary | ICD-10-CM | POA: Insufficient documentation

## 2017-05-16 MED FILL — LARIN FE 1-20 TABLET: 1-20 | 84 days supply | Qty: 84 | Fill #3

## 2017-05-23 ENCOUNTER — Encounter (HOSPITAL_COMMUNITY): Payer: Self-pay | Admitting: Psychiatry

## 2017-05-23 ENCOUNTER — Ambulatory Visit (INDEPENDENT_AMBULATORY_CARE_PROVIDER_SITE_OTHER): Payer: No Typology Code available for payment source | Admitting: Psychiatry

## 2017-05-23 VITALS — HR 105 | Ht 63.0 in | Wt 114.8 lb

## 2017-05-23 DIAGNOSIS — Z6379 Other stressful life events affecting family and household: Secondary | ICD-10-CM

## 2017-05-23 DIAGNOSIS — F419 Anxiety disorder, unspecified: Secondary | ICD-10-CM | POA: Diagnosis not present

## 2017-05-23 DIAGNOSIS — Z560 Unemployment, unspecified: Secondary | ICD-10-CM | POA: Diagnosis not present

## 2017-05-23 DIAGNOSIS — Z818 Family history of other mental and behavioral disorders: Secondary | ICD-10-CM | POA: Diagnosis not present

## 2017-05-23 DIAGNOSIS — F330 Major depressive disorder, recurrent, mild: Secondary | ICD-10-CM

## 2017-05-23 MED ORDER — BUPROPION HCL ER (XL) 300 MG PO TB24: 300.0000 mg | ORAL_TABLET | Freq: Every day | ORAL | 0 refills | Status: DC

## 2017-05-23 NOTE — Progress Notes (Signed)
Psychiatric Initial Adult Assessment   Patient Identification: Donna Phillips MRN:  [id number] Date of Evaluation:  05/23/2017 Referral Source: Primary care physician Dr. Willey Blade Chief Complaint:  I need a psychiatrist who can renew my medication. Visit Diagnosis:    ICD-10-CM   1. MDD (major depressive disorder), recurrent episode, mild (HCC) F33.0 buPROPion (WELLBUTRIN XL) 300 MG 24 hr tablet    History of Present Illness: Patient is 47 year old Caucasian, married, unemployed female who is referred from her primary care physician Dr. Willey Blade for the management of her psychiatric symptoms.  Patient has long history of depression and anxiety symptoms.  Currently she is taking Wellbutrin XL 300 mg daily however her primary care physician recommended that she should get her psychiatric medication from psychiatrist.  Patient told she started to have depressive symptoms in 2005 when she was in [place].  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  She admitted at that time very isolated, withdrawn, very depressed and having crying spells and poor sleep.  She started Prozac which worked very well until 2013 she stopped when she got pregnant.  After her daughter born in late 2013 she resumed prozac but it stopped working in 2017.  Her primary care physician tried Lexapro but she had a side effects and she stopped a few months later.  She was given Wellbutrin and since then she has been taking Wellbutrin XL 300 every day.  She like the Wellbutrin.  She has no depression but sometimes she gets irritable frustrated and angry.  She admitted most of the time she got frustrated when she is handling her 58-year-old daughter.  She denies any mania or psychosis.  She is sleeping good.  She denies any suicidal thoughts or homicidal thought.  She denies any aggressive behavior or any self abusive behavior.  She realized that her daughter is very demanding and she like to have some other outlets to  calm herself.  She enjoy reading and listening but only when she gets time to do that.  Her daughter goes to preschool 4 hours Monday to Friday and that is a time when she run errands.  She is hoping once her daughter start the school she has more time.  She also not working and she feel that she has too much time on her hand.  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  She lives with her husband who is very supportive.  Her husband is a Therapist, music and working at [employer].  Patient denies any headaches, nightmares, OCD, PTSD symptoms.  She denies any panic attacks but admitted sometimes anxious and nervous.  She has no side effects from Wellbutrin.  He does not want to change her medication.  Her parents live in [place], sister lives in [place] and brother lives in [place].  She has social network but most of the time she stays with her 64-year-old daughter.  Patient denies drinking alcohol or using any illegal substances.  Her energy level is fair.  Her appetite is okay.  There is no significant weight changes.  Associated Signs/Symptoms: Depression Symptoms:  anxiety, loss of energy/fatigue, (Hypo) Manic Symptoms:  Irritable Mood, Anxiety Symptoms:  Excessive Worry, Psychotic Symptoms:  No psychotic symptoms. PTSD Symptoms: Negative  Past Psychiatric History: Patient taking antidepressant since 2005.  She took Prozac for many years until it stopped working.  She tried briefly Lexapro but causes  side effects.  Patient denies any history of psychiatric inpatient treatment or any suicidal plan.  She denies any history of mania or psychosis.   Previous Psychotropic Medications: Yes   Substance Abuse History in the last 12 months:  No.  Consequences of Substance Abuse: Negative  Past Medical History:  Past Medical History:  Diagnosis Date  . Abnormal Pap smear   . AMA (advanced maternal age) multigravida 50+   . Anxiety    . Gestational diabetes    diet controlled  . H/O varicella   . HSV (herpes simplex virus) anogenital infection   . PONV (postoperative nausea and vomiting)     Past Surgical History:  Procedure Laterality Date  . CESAREAN SECTION  02/27/2012   Procedure: CESAREAN SECTION;  Surgeon: Linda Hedges, DO;  Location: [facility];  Service: Obstetrics;  Laterality: N/A;  Primary cesarean section with delivery of baby  girl at 88.  Apgars9/9.  Marland Kitchen HERNIA REPAIR  1974  . LEEP    . TONSILLECTOMY  1993    Family Psychiatric History: Brother has depression.  Family History:  Family History  Problem Relation Age of Onset  . Heart disease Paternal Uncle   . Diabetes Maternal Grandfather   . Heart disease Maternal Grandfather   . Heart attack Paternal Grandmother   . Heart attack Paternal Grandfather   . Stroke Paternal Grandfather   . Heart attack Father     Social History:   Social History   Socioeconomic History  . Marital status: Married    Spouse name: None  . Number of children: None  . Years of education: None  . Highest education level: None  Social Needs  . Financial resource strain: None  . Food insecurity - worry: None  . Food insecurity - inability: None  . Transportation needs - medical: None  . Transportation needs - non-medical: None  Occupational History  . None  Tobacco Use  . Smoking status: Never Smoker  . Smokeless tobacco: Never Used  Substance and Sexual Activity  . Alcohol use: No  . Drug use: No  . Sexual activity: Yes    Birth control/protection: None  Other Topics Concern  . None  Social History Narrative  . None    Additional Social History: Patient born and raised in [place].  She is married since 2011.  Her husband is very supportive.  Together they have 55-year-old daughter.  She had worked in the past as a Product/process development scientist.  Her parents live in [place], sister lives in [place] and brother lives in [place].  Allergies:   Allergies  Allergen  Reactions  . Codeine Anaphylaxis    Pt said childhood rxn with throat swelling per her mother, tolerated morphine in the OR w/o any problems.  Md said ok to try dilaudid  . Sulfa Drugs Cross Reactors Anaphylaxis  . Penicillins     Doesn't seem to work    Metabolic Disorder Labs: No results found for: HGBA1C, MPG No results found for: PROLACTIN Lab Results  Component Value Date   CHOL 158 02/16/2016   TRIG 40 02/16/2016   HDL 69 02/16/2016   CHOLHDL 2.3 02/16/2016   VLDL 8 02/16/2016   LDLCALC 81 02/16/2016     Current Medications: Current Outpatient Medications  Medication Sig Dispense Refill  . buPROPion (WELLBUTRIN XL) 300 MG 24 hr tablet Take 1 tablet (300 mg total) by mouth daily. 90 tablet 0  . LARIN FE 1/20 1-20 MG-MCG tablet Take 1 tablet by mouth  daily.  3  . Multiple Vitamins-Minerals (MULTIVITAMIN ADULT PO) Take by mouth.     No current facility-administered medications for this visit.     Neurologic: Headache: No Seizure: No Paresthesias:No  Musculoskeletal: Strength & Muscle Tone: within normal limits Gait & Station: normal Patient leans: N/A  Psychiatric Specialty Exam: Review of Systems  Constitutional: Negative.   HENT: Negative.   Eyes: Negative.   Respiratory: Negative.   Cardiovascular: Negative.   Gastrointestinal: Negative.   Genitourinary: Negative.   Musculoskeletal: Negative.   Skin: Negative.   Neurological: Negative.   Endo/Heme/Allergies: Negative.     Pulse (!) 105, height 5\' 3"  (1.6 m), weight 114 lb 12.8 oz (52.1 kg).Body mass index is 20.34 kg/m.  General Appearance: Well Groomed and Pleasant  Eye Contact:  Good  Speech:  Clear and Coherent  Volume:  Normal  Mood:  Euthymic  Affect:  Appropriate  Thought Process:  Goal Directed  Orientation:  Full (Time, Place, and Person)  Thought Content:  Logical  Suicidal Thoughts:  No  Homicidal Thoughts:  No  Memory:  Immediate;   Good Recent;   Good Remote;   Good  Judgement:   Good  Insight:  Good  Psychomotor Activity:  Normal  Concentration:  Concentration: Good and Attention Span: Good  Recall:  Good  Fund of Knowledge:Good  Language: Good  Akathisia:  No  Handed:  Right  AIMS (if indicated):  0  Assets:  Communication Skills Desire for Improvement Intimacy Resilience Social Support Talents/Skills  ADL's:  Intact  Cognition: WNL  Sleep: Good   Assessment: Major depressive disorder, recurrent.  Anxiety disorder NOS.  Plan: I review her symptoms, history, current medication, psychosocial stressors.  Patient is a stable on her Wellbutrin.  I will continue Wellbutrin XL 300 mg daily.  However I recommended she should see a therapist for coping skills.  We discussed issues related to parenting 35-year-old daughter I also recommend she should join volunteer work while patient is in preschool.  We will defer adding any medication at this time however if her irritability continues to get worse then we will consider adjusting her medication.  Discussed medication side effects and benefits.  Recommended to call us back if she has any question, concern if she feels worsening of the symptoms.  Discussed safety concerns at any time having active suicidal thoughts or homicidal thought and she need to call 911 or go to local emergency room.  Follow-up in 2 months.  [name], MD 2/25/201912:42 PM

## 2017-06-05 ENCOUNTER — Other Ambulatory Visit: Payer: Self-pay | Admitting: General Surgery

## 2017-06-05 ENCOUNTER — Ambulatory Visit (HOSPITAL_COMMUNITY)
Admission: RE | Admit: 2017-06-05 | Discharge: 2017-06-05 | Disposition: A | Discharge status: Home / Self Care | Payer: No Typology Code available for payment source | Source: Ambulatory Visit | Attending: General Surgery | Admitting: General Surgery

## 2017-06-05 DIAGNOSIS — W19XXXA Unspecified fall, initial encounter: Secondary | ICD-10-CM

## 2017-06-05 DIAGNOSIS — M25571 Pain in right ankle and joints of right foot: Secondary | ICD-10-CM | POA: Insufficient documentation

## 2017-06-05 DIAGNOSIS — M7989 Other specified soft tissue disorders: Secondary | ICD-10-CM | POA: Diagnosis present

## 2017-06-21 MED FILL — LARIN FE 1.5-30 TABLET: 1.5-30 | 28 days supply | Qty: 28 | Fill #0

## 2017-06-22 ENCOUNTER — Encounter (HOSPITAL_COMMUNITY): Payer: Self-pay | Admitting: Licensed Clinical Social Worker

## 2017-06-22 ENCOUNTER — Ambulatory Visit (INDEPENDENT_AMBULATORY_CARE_PROVIDER_SITE_OTHER): Payer: No Typology Code available for payment source | Admitting: Licensed Clinical Social Worker

## 2017-06-22 DIAGNOSIS — F330 Major depressive disorder, recurrent, mild: Secondary | ICD-10-CM | POA: Diagnosis not present

## 2017-06-22 NOTE — Progress Notes (Signed)
Comprehensive Clinical Assessment (CCA) Note  06/22/2017 Donna Phillips [id number]  Visit Diagnosis:      ICD-10-CM   1. MDD (major depressive disorder), recurrent episode, mild (HCC) F33.0       CCA Part One  Part One has been completed on paper by the patient.  (See scanned document in Chart Review)  CCA Part Two A  Intake/Chief Complaint:  CCA Intake With Chief Complaint CCA Part Two Date: 06/22/17 CCA Part Two Time: 1007 Chief Complaint/Presenting Problem: Dr. Adele Schilder referring pt for therapy. No previous psychiatrist and limited therapy. Depressive symptoms Patients Currently Reported Symptoms/Problems: Anger, depressive symptoms towards daughter and husband Collateral Involvement: Dr. Adriana Simas note Individual's Strengths: supportive family Individual's Preferences: prefers to be less angry Individual's Abilities: abilitiy to feel better Type of Services Patient Feels Are Needed: outpatient   Mental Health Symptoms Depression:  Depression: Irritability(anger, frustration)  Mania:     Anxiety:      Psychosis:     Trauma:     Obsessions:     Compulsions:     Inattention:     Hyperactivity/Impulsivity:     Oppositional/Defiant Behaviors:     Borderline Personality:     Other Mood/Personality Symptoms:      Mental Status Exam Appearance and self-care  Stature:  Stature: Average  Weight:  Weight: Average weight  Clothing:  Clothing: Casual  Grooming:  Grooming: Normal  Cosmetic use:  Cosmetic Use: None  Posture/gait:  Posture/Gait: Normal  Motor activity:  Motor Activity: Not Remarkable  Sensorium  Attention:  Attention: Normal  Concentration:  Concentration: Normal  Orientation:  Orientation: X5  Recall/memory:  Recall/Memory: Normal  Affect and Mood  Affect:  Affect: Appropriate  Mood:  Mood: Euthymic  Relating  Eye contact:  Eye Contact: Normal  Facial expression:  Facial Expression: Responsive  Attitude toward examiner:  Attitude Toward Examiner:  Cooperative  Thought and Language  Speech flow: Speech Flow: Normal  Thought content:  Thought Content: Appropriate to mood and circumstances  Preoccupation:     Hallucinations:     Organization:     Transport planner of Knowledge:  Fund of Knowledge: Average  Intelligence:  Intelligence: Above [employer]  Abstraction:  Abstraction: Normal  Judgement:  Judgement: Normal  Reality Testing:  Reality Testing: Adequate  Insight:  Insight: Good  Decision Making:  Decision Making: Normal  Social Functioning  Social Maturity:  Social Maturity: Responsible  Social Judgement:  Social Judgement: Normal  Stress  Stressors:  Stressors: Family conflict  Coping Ability:  Coping Ability: Deficient supports  Skill Deficits:     Supports:      Family and Psychosocial History: Family history Marital status: Married Does patient have children?: Yes How many children?: 1 How is patient's relationship with their children?: pt 89 daughter is 45 yo and pt is frustrated and becomes angry  Childhood History:  Childhood History By whom was/is the patient raised?: Both parents Additional childhood history information: good middle class upbringing Description of patient's relationship with caregiver when they were a child: very good Patient's description of current relationship with people who raised him/her: excellent, they live in [place] How were you disciplined when you got in trouble as a child/adolescent?: didn't really act up, mom gave [name] a look and that was enogh Does patient have siblings?: Yes Number of Siblings: 2 Description of patient's current relationship with siblings: brother lives in [place] and sister lives in [place], good relationship with both Did patient suffer any verbal/emotional/physical/sexual abuse as  a child?: No Did patient suffer from severe childhood neglect?: No Has patient ever been sexually abused/assaulted/raped as an adolescent or adult?: No Was the patient ever a  victim of a crime or a disaster?: No Witnessed domestic violence?: No Has patient been effected by domestic violence as an adult?: No  CCA Part Two B  Employment/Work Situation: Employment / Work Copywriter, advertising Employment situation: Unemployed What is the longest time patient has a held a job?: 7 years 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 Has patient ever been in the [employer]?: No Has patient ever served in combat?: No Are There Guns or Other Weapons in [place]?: No  Education: Education Did Teacher, adult education From [employer]?: Yes Did Physicist, medical?: Yes What Type of College Degree Do you Have?: BA Rehab services/psychology Did Heritage manager?: No Did You Have Any Difficulty At [employer]?: No  Religion: Religion/Spirituality Are You A Religious Person?: Yes  Leisure/Recreation: Leisure / Recreation Leisure and Hobbies: read, dance, sing,   Exercise/Diet: Exercise/Diet Do You Exercise?: Yes What Type of Exercise Do You Do?: Weight Training(eliptical) How Many Times a Week Do You Exercise?: 6-7 times a week Have You Gained or Lost A Significant Amount of Weight in the Past Six Months?: No Do You Follow a Special Diet?: No Do You Have Any Trouble Sleeping?: No  CCA Part Two C  Alcohol/Drug Use: Alcohol / Drug Use History of alcohol / drug use?: No history of alcohol / drug abuse                      CCA Part Three  ASAM's:  Six Dimensions of Multidimensional Assessment  Dimension 1:  Acute Intoxication and/or Withdrawal Potential:     Dimension 2:  Biomedical Conditions and Complications:     Dimension 3:  Emotional, Behavioral, or Cognitive Conditions and Complications:     Dimension 4:  Readiness to Change:     Dimension 5:  Relapse, Continued use, or Continued Problem Potential:     Dimension 6:  Recovery/Living Environment:      Substance use Disorder (SUD)    Social Function:  Social Functioning Social Maturity:  Responsible Social Judgement: Normal  Stress:  Stress Stressors: Family conflict Coping Ability: Deficient supports Patient Takes Medications The Way The Doctor Instructed?: Yes Priority Risk: Low Acuity  Risk Assessment- Self-Harm Potential: Risk Assessment For Self-Harm Potential Thoughts of Self-Harm: No current thoughts Method: No plan Availability of Means: No access/NA  Risk Assessment -Dangerous to Others Potential: Risk Assessment For Dangerous to Others Potential Method: No Plan Availability of Means: No access or NA Intent: Vague intent or NA  DSM5 Diagnoses: Patient Active Problem List   Diagnosis Date Noted  . MDD (major depressive disorder), recurrent episode, mild ([place]) 06/22/2017    Patient Centered Plan: Patient is on the following Treatment Plan(s):  depression  Recommendations for Services/Supports/Treatments: Recommendations for Services/Supports/Treatments Recommendations For Services/Supports/Treatments: Individual Therapy, Medication Management  Treatment Plan Summary:    Referrals to Alternative Service(s): Referred to Alternative Service(s):   Place:   Date:   Time:    Referred to Alternative Service(s):   Place:   Date:   Time:    Referred to Alternative Service(s):   Place:   Date:   Time:    Referred to Alternative Service(s):   Place:   Date:   Time:     Jenkins Rouge

## 2017-07-04 MED FILL — BUPROPION HCL XL 300 MG TAB: 300 | 90 days supply | Qty: 90 | Fill #2

## 2017-07-21 ENCOUNTER — Ambulatory Visit (INDEPENDENT_AMBULATORY_CARE_PROVIDER_SITE_OTHER): Payer: No Typology Code available for payment source | Admitting: Psychiatry

## 2017-07-21 ENCOUNTER — Encounter (HOSPITAL_COMMUNITY): Payer: Self-pay | Admitting: Psychiatry

## 2017-07-21 DIAGNOSIS — F330 Major depressive disorder, recurrent, mild: Secondary | ICD-10-CM

## 2017-07-21 DIAGNOSIS — F411 Generalized anxiety disorder: Secondary | ICD-10-CM

## 2017-07-21 MED ORDER — BUPROPION HCL ER (XL) 300 MG PO TB24: 300.0000 mg | ORAL_TABLET | Freq: Every day | ORAL | 0 refills | Status: DC

## 2017-07-21 NOTE — Progress Notes (Signed)
BH MD/PA/NP OP Progress Note  07/21/2017 10:38 AM Donna Phillips  MRN:  [id number]  Chief Complaint: I am doing better.  HPI: Donna Phillips is a 47 year old Caucasian married unemployed female who was seen first time 10 weeks ago.  She was referred from Dr. Willey Blade for the management of her depression.  Patient has a history of depression and anxiety symptoms.  She has been stable on Wellbutrin XL 300 mg daily however her primary care physician recommended that she should see psychiatrist for refills.  Patient doing very well on Wellbutrin.  She denies any irritability, anger, mania, psychosis or any hallucination.  She gets some time frustrated because she has a 44-year-old who can be demanding.  She is now thinking seriously to go back to part-time work.  She also started therapy which we recommended.  She is doing much better.  She is seeing Charolotte Eke for CBT.  Patient has no tremors, shakes or any EPS.  She denies any paranoia or any suicidal thoughts.  Her energy level is good.  She is more active and she lost few pounds since the last visit.  Patient had blood work last December and she was told everything is normal.  Patient denies drinking alcohol or using any illegal substances.  Patient lives with her husband who has been very supportive.  Patient denies drinking alcohol or using any illegal substances.  Visit Diagnosis:    ICD-10-CM   1. MDD (major depressive disorder), recurrent episode, mild (HCC) F33.0 buPROPion (WELLBUTRIN XL) 300 MG 24 hr tablet    Past Psychiatric History: Reviewed. Patient taking antidepressant since 2005.  She took Prozac until 2013 when she got pregnant.  She also tried briefly Lexapro but causes side effects.  Patient denies any history of psychiatric inpatient treatment or any suicidal attempt.  She denies any history of mania psychosis.  Past Medical History:  Past Medical History:  Diagnosis Date  . Abnormal Pap smear   . AMA (advanced maternal age) multigravida  22+   . Anxiety   . Gestational diabetes    diet controlled  . H/O varicella   . HSV (herpes simplex virus) anogenital infection   . PONV (postoperative nausea and vomiting)     Past Surgical History:  Procedure Laterality Date  . CESAREAN SECTION  02/27/2012   Procedure: CESAREAN SECTION;  Surgeon: Linda Hedges, DO;  Location: [facility];  Service: Obstetrics;  Laterality: N/A;  Primary cesarean section with delivery of baby  girl at 24.  Apgars9/9.  Marland Kitchen HERNIA REPAIR  1974  . LEEP    . TONSILLECTOMY  1993    Family Psychiatric History: Reviewed.  Family History:  Family History  Problem Relation Age of Onset  . Heart disease Paternal Uncle   . Diabetes Maternal Grandfather   . Heart disease Maternal Grandfather   . Heart attack Paternal Grandmother   . Heart attack Paternal Grandfather   . Stroke Paternal Grandfather   . Heart attack Father     Social History:  Social History   Socioeconomic History  . Marital status: Married    Spouse name: Not on file  . Number of children: Not on file  . Years of education: Not on file  . Highest education level: Not on file  Occupational History  . Not on file  Social Needs  . Financial resource strain: Not on file  . Food insecurity:    Worry: Not on file    Inability: Not on file  . Transportation  needs:    Medical: Not on file    Non-medical: Not on file  Tobacco Use  . Smoking status: Never Smoker  . Smokeless tobacco: Never Used  Substance and Sexual Activity  . Alcohol use: No  . Drug use: No  . Sexual activity: Yes    Birth control/protection: None  Lifestyle  . Physical activity:    Days per week: Not on file    Minutes per session: Not on file  . Stress: Not on file  Relationships  . Social connections:    Talks on phone: Not on file    Gets together: Not on file    Attends religious service: Not on file    Active member of club or organization: Not on file    Attends meetings of clubs or organizations:  Not on file    Relationship status: Not on file  Other Topics Concern  . Not on file  Social History Narrative  . Not on file    Allergies:  Allergies  Allergen Reactions  . Codeine Anaphylaxis    Pt said childhood rxn with throat swelling per her mother, tolerated morphine in the OR w/o any problems.  Md said ok to try dilaudid  . Sulfa Drugs Cross Reactors Anaphylaxis  . Penicillins     Doesn't seem to work    Metabolic Disorder Labs: No results found for: HGBA1C, MPG No results found for: PROLACTIN Lab Results  Component Value Date   CHOL 158 02/16/2016   TRIG 40 02/16/2016   HDL 69 02/16/2016   CHOLHDL 2.3 02/16/2016   VLDL 8 02/16/2016   LDLCALC 81 02/16/2016   No results found for: TSH  Therapeutic Level Labs: No results found for: LITHIUM No results found for: VALPROATE No components found for:  CBMZ  Current Medications: Current Outpatient Medications  Medication Sig Dispense Refill  . buPROPion (WELLBUTRIN XL) 300 MG 24 hr tablet Take 1 tablet (300 mg total) by mouth daily. 90 tablet 0  . LARIN FE 1/20 1-20 MG-MCG tablet Take 1 tablet by mouth daily.  3  . Multiple Vitamins-Minerals (MULTIVITAMIN ADULT PO) Take by mouth.     No current facility-administered medications for this visit.      Musculoskeletal: Strength & Muscle Tone: within normal limits Gait & Station: normal Patient leans: N/A  Psychiatric Specialty Exam: ROS  Blood pressure (!) 140/98, pulse (!) 112, height 5\' 3"  (1.6 m), weight 111 lb (50.3 kg).Body mass index is 19.66 kg/m.  General Appearance: Casual  Eye Contact:  Good  Speech:  Clear and Coherent  Volume:  Normal  Mood:  Euthymic  Affect:  Appropriate  Thought Process:  Goal Directed  Orientation:  Full (Time, Place, and Person)  Thought Content: Logical   Suicidal Thoughts:  No  Homicidal Thoughts:  No  Memory:  Immediate;   Good Recent;   Good Remote;   Good  Judgement:  Good  Insight:  Good  Psychomotor  Activity:  Normal  Concentration:  Concentration: Good and Attention Span: Good  Recall:  Good  Fund of Knowledge: Good  Language: Good  Akathisia:  No  Handed:  Right  AIMS (if indicated): not done  Assets:  Communication Skills Desire for Improvement Housing Resilience Social Support Talents/Skills  ADL's:  Intact  Cognition: WNL  Sleep:  Good   Screenings:   Assessment and Plan: Major depressive disorder, recurrent.  Generalized anxiety disorder.  Patient is a stable on Wellbutrin XL 300 mg daily.  She has  no side effects.  I encouraged to continue CBT with Charolotte Eke.  I also recommended to have her blood work bring [name] when she see on her next appointment.  Discussed medication side effects and benefits.  Recommended to call us back if she has any question, concern if she feels worsening of the symptoms.  Follow-up in 3 months.   [name], MD 07/21/2017, 10:38 AM

## 2017-07-26 ENCOUNTER — Ambulatory Visit (INDEPENDENT_AMBULATORY_CARE_PROVIDER_SITE_OTHER): Payer: No Typology Code available for payment source | Admitting: Licensed Clinical Social Worker

## 2017-07-26 DIAGNOSIS — F330 Major depressive disorder, recurrent, mild: Secondary | ICD-10-CM | POA: Diagnosis not present

## 2017-07-27 ENCOUNTER — Encounter (HOSPITAL_COMMUNITY): Payer: Self-pay | Admitting: Licensed Clinical Social Worker

## 2017-07-27 NOTE — Progress Notes (Signed)
   THERAPIST PROGRESS NOTE  Session Time: 11:10-12:00 pm  Participation Level: Active  Behavioral Response: CasualAlertEuthymic  Type of Therapy: Individual Therapy  Treatment Goals addressed: Depression  Interventions: CBT  Summary: Donna Phillips is a 47 y.o. female who presents with depression for her initial individual counseling session. Spent a considerable amount of time building a trusting therapeutic relationship. Pt is married with 1 child age 70. Her child is her biggest stressor. She and her child argue which ramps up anxiety and anger of pt. Pt sees Dr. Adele Schilder whom monitors her meds. She feels they are working well. She feels her anger has decreased. Discussed CBT concepts with pt and the thought emotion connection and alternative perspectives. Taught pt mindfulness activities and explained the process, purpose, and practice of mindfulness techniques. Pt and clinician discussed healthy coping skills with daughter: yoga, intervening 1x request. Pt is also looking for a part time job which should help alleviate isolating.  Suicidal/Homicidal: Nowithout intent/plan  Therapist Response: Assessed pt's current functioning and reviewed progress. Assisted pt building a trusting therapeutic relationship. Assisted pt processing CBT concepts, mindfulness activities and healthy coping skills. Assisted pr processing for the management of her stressors.  Plan: Return again in 2 weeks.  Diagnosis: Axis I: MDD, recurrent episode mild    Shiana Rappleye S, LCAS 07/26/17

## 2017-08-09 ENCOUNTER — Encounter (HOSPITAL_COMMUNITY): Payer: Self-pay | Admitting: Licensed Clinical Social Worker

## 2017-08-09 ENCOUNTER — Ambulatory Visit (INDEPENDENT_AMBULATORY_CARE_PROVIDER_SITE_OTHER): Payer: No Typology Code available for payment source | Admitting: Licensed Clinical Social Worker

## 2017-08-09 DIAGNOSIS — F330 Major depressive disorder, recurrent, mild: Secondary | ICD-10-CM

## 2017-08-09 MED FILL — LARIN FE 1.5-30 TABLET: 1.5-30 | 84 days supply | Qty: 84 | Fill #1

## 2017-08-09 NOTE — Progress Notes (Addendum)
   THERAPIST PROGRESS NOTE  Session Time: 11:10-12:00 pm  Participation Level: Active  Behavioral Response: CasualAlertEuthymic  Type of Therapy: Individual Therapy  Treatment Goals addressed: Depression  Interventions: CBT  Summary: Donna Phillips is a 47 y.o. female who presents with depression for her individual counseling session. Pt discussed her psychiatric symptoms and current life events. Pt presented euthymic today. She reports that coping skills discussed at last appointment have been working well. Pt has talked to her daughter and found  mother/daughter yoga on [employer] and her daughter is very excited at the bonding time together. Pt has been practicing breathing techniques which has placed her in calm. This mood has assisted pt dealing with her child, instead of the previous yelling matches. Pt has been following through with consequences for bad behavior. Pt has found her "happy place," where she can go when she becomes stressed. All these techniques have made the relationship with her daughter and husband more inviting. Pt practiced all coping techniques and asked questions. Pt has reached out to friends for socialization. Pt continues to look for part time employment. Pt completed PHQ9 screening tool.       Suicidal/Homicidal: Nowithout intent/plan  Therapist Response: Assessed pt's current functioning and reviewed progress. Assisted pt processing coping techniques for stressors, while practicing them. Processed with pt family relationships after pt is working on her own mood, socialization. Asisted pt processing for the management of her stressors.   Plan: Return again in 2 weeks with depression workbook  Diagnosis: Axis I: MDD, recurrent episode mild    Mabell Esguerra S, LCAS 08/09/17

## 2017-09-01 ENCOUNTER — Ambulatory Visit (HOSPITAL_COMMUNITY): Payer: Self-pay | Admitting: Licensed Clinical Social Worker

## 2017-09-12 ENCOUNTER — Encounter (HOSPITAL_COMMUNITY): Payer: Self-pay | Admitting: Licensed Clinical Social Worker

## 2017-09-12 ENCOUNTER — Ambulatory Visit (INDEPENDENT_AMBULATORY_CARE_PROVIDER_SITE_OTHER): Payer: No Typology Code available for payment source | Admitting: Licensed Clinical Social Worker

## 2017-09-12 DIAGNOSIS — F330 Major depressive disorder, recurrent, mild: Secondary | ICD-10-CM

## 2017-09-12 NOTE — Progress Notes (Signed)
   THERAPIST PROGRESS NOTE  Session Time: 3:10-4pm  Participation Level: Active  Behavioral Response: CasualAlertEuthymic  Type of Therapy: Individual Therapy  Treatment Goals addressed: Depression  Interventions: CBT  Summary: ANJELI CASAD is a 47 y.o. female who presents with depression for her individual counseling session. Pt discussed her psychiatric symptoms and current life events. Pt reports her depressive symptoms have lessened and she has less depressed days. She thinks her medications are working. Pt discusses her coping tools that she has been using to help deal with power struggles with her 45yo. Role played this with pt. Pt presents less anxious today. Her daughter has some summer camps but most of the time will be spent at home. Pt has been trying to arrange outings for daughter. Pt reports her anxiety and stress symptoms have decreased pt is using consequences with her daughter and following through with them. Pt used to engage in power struggles which would ensure into yelling matches. Pt reports her husband can also tell a difference in pt and her daughter's engagement. Pt talked about the decision she and her husband made to adopt 1 or 2 children. Asked open ended questions and used empathic reflection. Pt requested a letter to send to [facility] about her therapy. Pt signed a ROI for a letter to be sent.     Suicidal/Homicidal: Nowithout intent/plan  Therapist Response: Assessed pt's current functioning and reviewed progress. Assisted pt processing coping techniques for stressors, parenting skills, adoption.  Asisted pt processing  the management of her stressors.   Plan: Return again in 2 weeks with depression workbook 1 & 2.  Diagnosis: Axis I: MDD, recurrent episode mild    Maximillion Gill S, LCAS 09/12/17

## 2017-10-03 MED FILL — BUPROPION HCL XL 300 MG TAB: 300 | 90 days supply | Qty: 90 | Fill #3

## 2017-10-04 ENCOUNTER — Ambulatory Visit (HOSPITAL_COMMUNITY): Payer: Self-pay | Admitting: Licensed Clinical Social Worker

## 2017-10-18 ENCOUNTER — Ambulatory Visit (HOSPITAL_COMMUNITY): Payer: Self-pay | Admitting: Licensed Clinical Social Worker

## 2017-10-20 ENCOUNTER — Ambulatory Visit (HOSPITAL_COMMUNITY): Payer: Self-pay | Admitting: Psychiatry

## 2017-10-31 MED FILL — LARIN FE 1.5-30 TABLET: 1.5-30 | 84 days supply | Qty: 84 | Fill #2

## 2017-11-01 ENCOUNTER — Ambulatory Visit (HOSPITAL_COMMUNITY): Payer: Self-pay | Admitting: Licensed Clinical Social Worker

## 2017-11-21 ENCOUNTER — Other Ambulatory Visit (HOSPITAL_COMMUNITY): Payer: Self-pay | Admitting: Obstetrics and Gynecology

## 2017-11-21 DIAGNOSIS — Z1239 Encounter for other screening for malignant neoplasm of breast: Secondary | ICD-10-CM

## 2017-12-06 ENCOUNTER — Encounter (HOSPITAL_COMMUNITY): Payer: Self-pay | Admitting: Psychiatry

## 2017-12-06 ENCOUNTER — Ambulatory Visit (HOSPITAL_COMMUNITY): Payer: Self-pay | Admitting: Psychiatry

## 2017-12-06 ENCOUNTER — Ambulatory Visit (HOSPITAL_COMMUNITY): Payer: No Typology Code available for payment source | Admitting: Psychiatry

## 2017-12-06 DIAGNOSIS — F330 Major depressive disorder, recurrent, mild: Secondary | ICD-10-CM | POA: Diagnosis not present

## 2017-12-06 MED ORDER — BUPROPION HCL ER (XL) 300 MG PO TB24: 300.0000 mg | ORAL_TABLET | Freq: Every day | ORAL | 0 refills | Status: DC

## 2017-12-06 NOTE — Progress Notes (Signed)
[facility] MD/PA/NP OP Progress Note  12/06/2017 12:15 PM Donna Phillips  MRN:  [id number]  Chief Complaint: Patient returns for medication management appointment HPI: I am covering for Dr. Adele Schilder who is currently out of office, patient understands she will continue to follow-up with Dr. Adele Schilder . History of MDD diagnosis. Returns today for scheduled follow-up appointment.   States she has been doing very well.  At this time denies depression or significant neurovegetative symptoms .  No anhedonia, presents euthymic with a full range of affect.  Reports her life is going well, she is enjoying her family and spoke about her daughter entering first grade. Denies any current significant stressors. Regarding medication, states she has been on Wellbutrin XL for a long period of time without adverse effects and with good response.  Feels medication has been effective and well-tolerated.  Of note denies any history of seizures, head trauma or history of eating disorder.   Visit Diagnosis:    ICD-10-CM   1. MDD (major depressive disorder), recurrent episode, mild (HCC) F33.0 buPROPion (WELLBUTRIN XL) 300 MG 24 hr tablet    Past Psychiatric History:   Past Medical History:  Past Medical History:  Diagnosis Date  . Abnormal Pap smear   . AMA (advanced maternal age) multigravida 37+   . Anxiety   . Gestational diabetes    diet controlled  . H/O varicella   . HSV (herpes simplex virus) anogenital infection   . PONV (postoperative nausea and vomiting)     Past Surgical History:  Procedure Laterality Date  . CESAREAN SECTION  02/27/2012   Procedure: CESAREAN SECTION;  Surgeon: Linda Hedges, DO;  Location: [facility];  Service: Obstetrics;  Laterality: N/A;  Primary cesarean section with delivery of baby  girl at 27.  Apgars9/9.  Marland Kitchen HERNIA REPAIR  1974  . LEEP    . TONSILLECTOMY  1993    Family Psychiatric History:   Family History:  Family History  Problem Relation Age of Onset  . Heart disease  Paternal Uncle   . Diabetes Maternal Grandfather   . Heart disease Maternal Grandfather   . Heart attack Paternal Grandmother   . Heart attack Paternal Grandfather   . Stroke Paternal Grandfather   . Heart attack Father     Social History:  Social History   Socioeconomic History  . Marital status: Married    Spouse name: Not on file  . Number of children: Not on file  . Years of education: Not on file  . Highest education level: Not on file  Occupational History  . Not on file  Social Needs  . Financial resource strain: Not on file  . Food insecurity:    Worry: Not on file    Inability: Not on file  . Transportation needs:    Medical: Not on file    Non-medical: Not on file  Tobacco Use  . Smoking status: Never Smoker  . Smokeless tobacco: Never Used  Substance and Sexual Activity  . Alcohol use: No  . Drug use: No  . Sexual activity: Yes    Birth control/protection: None  Lifestyle  . Physical activity:    Days per week: Not on file    Minutes per session: Not on file  . Stress: Not on file  Relationships  . Social connections:    Talks on phone: Not on file    Gets together: Not on file    Attends religious service: Not on file    Active member  of club or organization: Not on file    Attends meetings of clubs or organizations: Not on file    Relationship status: Not on file  Other Topics Concern  . Not on file  Social History Narrative  . Not on file    Allergies:  Allergies  Allergen Reactions  . Codeine Anaphylaxis    Pt said childhood rxn with throat swelling per her mother, tolerated morphine in the OR w/o any problems.  Md said ok to try dilaudid  . Sulfa Drugs Cross Reactors Anaphylaxis  . Penicillins     Doesn't seem to work    Metabolic Disorder Labs: No results found for: HGBA1C, MPG No results found for: PROLACTIN Lab Results  Component Value Date   CHOL 158 02/16/2016   TRIG 40 02/16/2016   HDL 69 02/16/2016   CHOLHDL 2.3  02/16/2016   VLDL 8 02/16/2016   LDLCALC 81 02/16/2016   No results found for: TSH  Therapeutic Level Labs: No results found for: LITHIUM No results found for: VALPROATE No components found for:  CBMZ  Current Medications: Current Outpatient Medications  Medication Sig Dispense Refill  . buPROPion (WELLBUTRIN XL) 300 MG 24 hr tablet Take 1 tablet (300 mg total) by mouth daily. 90 tablet 0  . LARIN FE 1/20 1-20 MG-MCG tablet Take 1 tablet by mouth daily.  3  . Multiple Vitamins-Minerals (MULTIVITAMIN ADULT PO) Take by mouth.     No current facility-administered medications for this visit.      Musculoskeletal: Strength & Muscle Tone: within normal limits Gait & Station: normal Patient leans: N/A  Psychiatric Specialty Exam: ROS  no chest pain, no shortness of breath, no vomiting  Blood pressure (!) 142/82, pulse 80, height 5\' 3"  (1.6 m), weight 51.7 kg.Body mass index is 20.19 kg/m.  General Appearance: Well Groomed  Eye Contact:  Good  Speech:  Normal Rate  Volume:  Normal  Mood:  Euthymic  Affect:  Full Range and Bright  Thought Process:  Linear and Descriptions of Associations: Intact  Orientation:  Full (Time, Place, and Person)  Thought Content: No hallucinations, no delusions   Suicidal Thoughts:  No-no suicidal ideations, no self-injurious ideations  Homicidal Thoughts:  No no homicidal or violent ideations  Memory:  Recent and remote grossly intact  Judgement:  Good  Insight:  Good  Psychomotor Activity:  Normal  Concentration:  Concentration: Good and Attention Span: Good  Recall:  Good  Fund of Knowledge: Good  Language: Good  Akathisia:  Negative  Handed:  Right  AIMS (if indicated): AIMS test note done   Assets:  Desire for Improvement Resilience  ADL's:  Intact  Cognition: WNL  Sleep:  Good   Screenings:   Assessment and Plan: 47 year old female, history of depression.  Returns for medication management appointment.  Reports she is doing well  without significant symptoms of depression, functioning well in her daily activities, presents euthymic with a full range of affect and no significant neurovegetative symptoms.  No suicidal ideations.  Tolerating Wellbutrin XL well without side effects. Review Wellbutrin XL 300 mg daily x3 months/no refills.  Patient to return in 3 months.  Agrees to contact clinic/return sooner should there be any worsening or concerns prior.   Jenne Campus, MD 12/06/2017, 12:15 PM

## 2017-12-07 ENCOUNTER — Ambulatory Visit (HOSPITAL_COMMUNITY): Payer: Self-pay

## 2017-12-09 ENCOUNTER — Ambulatory Visit (HOSPITAL_COMMUNITY): Payer: Self-pay

## 2017-12-13 ENCOUNTER — Encounter (HOSPITAL_COMMUNITY): Payer: Self-pay | Admitting: Licensed Clinical Social Worker

## 2017-12-13 ENCOUNTER — Ambulatory Visit (INDEPENDENT_AMBULATORY_CARE_PROVIDER_SITE_OTHER): Payer: No Typology Code available for payment source | Admitting: Licensed Clinical Social Worker

## 2017-12-13 DIAGNOSIS — F330 Major depressive disorder, recurrent, mild: Secondary | ICD-10-CM

## 2017-12-13 NOTE — Progress Notes (Signed)
   THERAPIST PROGRESS NOTE  Session Time: 9:10-10am  Participation Level: Active  Behavioral Response: CasualAlertEuthymic  Type of Therapy: Individual Therapy  Treatment Goals addressed: Depression  Interventions: CBT  Summary: Donna Phillips is a 47 y.o. female who presents with depression for her individual counseling session. Pt discussed her psychiatric symptoms and current life events. Pt reports her depressive symptoms have lessened and she continues to have less depressed days. She thinks her medications are working. She met with Dr. Parke Poisson who was filling in for Dr. Adele Schilder who was out on medical leave. He did not change her meds. Pt shared her daughter has started kindergarten. Discussed power struggles between pt and her 45 yo. Pt reports the power struggles have gotten better by her using suggestions discussed in therapy. Pt shared her husband is in graduate school on line. He is under stress, with working, school, family, studying. She feels they are not communicating well. Educated pt on effective communication skills. Role played with pt how to engage her husband in an adult conversation. Pt is unsure if this is a good time for she and her husband look at adopting other children. Asked open ended questions and used empathic reflection.         Suicidal/Homicidal: Nowithout intent/plan  Therapist Response: Assessed pt'Phillips current functioning and reviewed progress. Assisted pt processing effective communication skills, parenting skills, adoption.  Asisted pt processing  the management of her stressors.   Plan: Return again in 2 weeks  Diagnosis: Axis I: MDD, recurrent episode mild    Donna Phillips, LCAS 12/13/17

## 2017-12-14 ENCOUNTER — Ambulatory Visit (HOSPITAL_COMMUNITY)
Admission: RE | Admit: 2017-12-14 | Discharge: 2017-12-14 | Disposition: A | Discharge status: Home / Self Care | Payer: No Typology Code available for payment source | Source: Ambulatory Visit | Attending: Obstetrics and Gynecology | Admitting: Obstetrics and Gynecology

## 2017-12-14 DIAGNOSIS — Z1239 Encounter for other screening for malignant neoplasm of breast: Secondary | ICD-10-CM

## 2017-12-14 DIAGNOSIS — Z1231 Encounter for screening mammogram for malignant neoplasm of breast: Secondary | ICD-10-CM | POA: Diagnosis not present

## 2018-01-02 MED FILL — BuPROPion HCL ER (XL) 300 M: 300 | 90 days supply | Qty: 90 | Fill #0

## 2018-01-11 ENCOUNTER — Encounter (HOSPITAL_COMMUNITY): Payer: Self-pay | Admitting: Licensed Clinical Social Worker

## 2018-01-11 ENCOUNTER — Ambulatory Visit (INDEPENDENT_AMBULATORY_CARE_PROVIDER_SITE_OTHER): Payer: No Typology Code available for payment source | Admitting: Licensed Clinical Social Worker

## 2018-01-11 DIAGNOSIS — F330 Major depressive disorder, recurrent, mild: Secondary | ICD-10-CM | POA: Diagnosis not present

## 2018-01-11 NOTE — Progress Notes (Signed)
   THERAPIST PROGRESS NOTE  Session Time: 9:10-10am  Participation Level: Active  Behavioral Response: CasualAlertEuthymic  Type of Therapy: Individual Therapy/[place] planning  Treatment Goals addressed: Depression  Interventions: CBT  Summary: Donna Phillips is a 47 y.o. female who presents with depression for her individual counseling session. Pt discussed her psychiatric symptoms and current life events. Pt reports her depressive symptoms have lessened and she continues to have less depressed days. She thinks her medications are working well with no side effects. Pt has an appt with Dr. Adele Schilder 12/10 for medication management. Pt and her husband are in the midst of the process of foster to adoption of a child. They are currently in classes at [employer]. Completed a reference letter for pt. Her husband is in graduate school and works full time. He comes home from work and stays in his office most of the evening. She is having 2nd thoughts about foster/adoption because she realizes it will be her raising the child for the next few years. Her husband is supportive and is a wonderful father to their daughter, but is currently unavailable due to online graduate school. Asked open ended questions about her feelings about adoption, her husband not being as available. Pt is interested in getting a at home part time job. Educated pt on career counseling with suggestions of at-home job websites. Reviewed tx plan with pt.   Suicidal/Homicidal: Nowithout intent/plan  Therapist Response: Assessed pt's current functioning and reviewed progress. 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  Asisted pt processing  the management of her stressors.   Plan: Return again in 2 weeks anger, feelings about adoption, followup about work possibilities  Diagnosis: Axis I: MDD, recurrent episode mild    Merrianne Mccumbers S, LCAS 01/11/18

## 2018-01-25 ENCOUNTER — Ambulatory Visit (HOSPITAL_COMMUNITY): Payer: Self-pay | Admitting: Licensed Clinical Social Worker

## 2018-01-30 MED FILL — LARIN FE 1.5-30 TABLET: 1.5-30 | 84 days supply | Qty: 84 | Fill #3

## 2018-02-08 ENCOUNTER — Ambulatory Visit (INDEPENDENT_AMBULATORY_CARE_PROVIDER_SITE_OTHER): Payer: No Typology Code available for payment source | Admitting: Licensed Clinical Social Worker

## 2018-02-08 ENCOUNTER — Encounter (HOSPITAL_COMMUNITY): Payer: Self-pay | Admitting: Licensed Clinical Social Worker

## 2018-02-08 DIAGNOSIS — F330 Major depressive disorder, recurrent, mild: Secondary | ICD-10-CM

## 2018-02-08 NOTE — Progress Notes (Signed)
   THERAPIST PROGRESS NOTE  Session Time: 10:10-11am  Participation Level: Active  Behavioral Response: Casual/Alert/Anxious  Type of Therapy: Individual Therapy  Treatment Goals addressed: Improve Psychiatric Symptoms, elevate mood (increased self-esteem, increased self-compassion, increased interaction), improve unhelpful thought patterns, controlled behavior, moderate mood, deliberate speech and thought process(improved social functioning, healthy adjustment to living situation), Learn about diagnosis, healthy coping skills.  Interventions: CBT  Summary: Donna Phillips is a 47 y.o. female who presents with depression for her individual counseling session. Pt discussed her psychiatric symptoms and current life events. Pt reports her moods have become more stable but she still has some depressive moments. She sees Dr. Adele Schilder psychiatrist in December. 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 Her husband continues in graduate school and works full time, which continues to bring most of the responsibility on pt. Asked open ended questions where pt discussed her feelings about this arrangement.  She continues to have 2nd thoughts about foster/adoption because she realizes it will be her raising the child for the next few years. Her husband is supportive and is a wonderful father to their daughter, but is currently unavailable due to online graduate school. Asked open ended questions about her feelings about adoption, her husband not being as available. Pt used available resources for in-home work provided but has not found anything suitable. She continues to look. Discussed with pt the importance of self care and advised pt on methods of self soothing.     Suicidal/Homicidal: Nowithout intent/plan  Therapist Response: Assessed pt's current functioning and reviewed progress. 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  Asisted pt processing  the management of her stressors.   Plan: Return again in 2 weeks anger, feelings about feelings about fostering, followup about work possibilities  Diagnosis: Axis I: MDD, recurrent episode mild    Winnie Barsky S, LCAS 02/08/18

## 2018-02-09 NOTE — Addendum Note (Signed)
Addended by: Jenkins Rouge on: 02/09/2018 11:08 AM   Modules accepted: Level of Service

## 2018-02-20 ENCOUNTER — Ambulatory Visit (INDEPENDENT_AMBULATORY_CARE_PROVIDER_SITE_OTHER): Payer: No Typology Code available for payment source | Admitting: Licensed Clinical Social Worker

## 2018-02-20 ENCOUNTER — Encounter (HOSPITAL_COMMUNITY): Payer: Self-pay | Admitting: Licensed Clinical Social Worker

## 2018-02-20 DIAGNOSIS — F330 Major depressive disorder, recurrent, mild: Secondary | ICD-10-CM | POA: Diagnosis not present

## 2018-02-20 NOTE — Progress Notes (Signed)
   THERAPIST PROGRESS NOTE  Session Time: 9:10-10am  Participation Level: Active  Behavioral Response: Casual/Alert/Anxious  Type of Therapy: Individual Therapy  Treatment Goals addressed: Improve Psychiatric Symptoms, elevate mood (increased self-esteem, increased self-compassion, increased interaction), improve unhelpful thought patterns, controlled behavior, moderate mood, deliberate speech and thought process(improved social functioning, healthy adjustment to living situation), Learn about diagnosis, healthy coping skills.  Interventions: CBT  Summary: Donna Phillips is a 47 y.o. female who presents with depression for her individual counseling session. Pt discussed her psychiatric symptoms and current life events. Pt reports her moods have become more stable but she still has some depressive moments. She sees Dr. Adele Schilder psychiatrist in December. 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 They have not received any calls about children available. Pt continues to have feelings about the adoption  Decision. Processed this at length with pt and validated her feelings. Pt has concerns about how it will change their life as well as her daughter who is 6. She and her husband continue to discuss the ramifications on their family. She really felt heard by her husband on this subject. Discussed with pt the importance of self care and advised pt on methods of self soothing.     Suicidal/Homicidal: Nowithout intent/plan  Therapist Response: Assessed pt's current functioning and reviewed progress. Assisted pt processing  Fostering/adopting children, self care, self soothing.  Asisted pt processing  the management of her stressors.   Plan: Return again in 2 weeks anger, feelings about feelings about fostering, followup about work possibilities  Diagnosis: Axis I: MDD, recurrent episode mild    Lamar Naef S, LCAS 02/20/18

## 2018-03-07 ENCOUNTER — Encounter (HOSPITAL_COMMUNITY): Payer: Self-pay | Admitting: Psychiatry

## 2018-03-07 ENCOUNTER — Ambulatory Visit (INDEPENDENT_AMBULATORY_CARE_PROVIDER_SITE_OTHER): Payer: No Typology Code available for payment source | Admitting: Psychiatry

## 2018-03-07 VITALS — BP 160/92 | HR 99 | Ht 63.0 in | Wt 112.0 lb

## 2018-03-07 DIAGNOSIS — F330 Major depressive disorder, recurrent, mild: Secondary | ICD-10-CM | POA: Diagnosis not present

## 2018-03-07 DIAGNOSIS — F411 Generalized anxiety disorder: Secondary | ICD-10-CM

## 2018-03-07 MED ORDER — BUPROPION HCL ER (XL) 300 MG PO TB24: 300.0000 mg | ORAL_TABLET | Freq: Every day | ORAL | 0 refills | Status: DC

## 2018-03-07 NOTE — Progress Notes (Signed)
BH MD/PA/NP OP Progress Note  03/07/2018 10:35 AM Donna Phillips  MRN:  [id number]  Chief Complaint: I am doing good.  I am taking my medication.  HPI: Donna Phillips came for her follow-up appointment.  She is taking Wellbutrin XL 300 mg daily.  She denies any irritability, anger, mania or any psychosis.  Her depression is stable and she denies any nervousness or any major panic attack.  Her daughter Donna Phillips is now in elementary school.  Patient tried to spend time with a day going to gym and workout.  Her energy level is good.  Today she has mild elevation of blood pressure as she was find parking and is stressed out.  She has no chest pain or headaches.  She is seeing Charolotte Eke for therapy.  On her last visit she saw Dr. Parke Poisson who did not change her medication.  Patient is scheduled to see her primary care physician in January and she will had blood work.  Patient lives with her husband who is been supportive.  Patient denies drinking or using any illegal substances.  She wants to continue her Wellbutrin.  Visit Diagnosis:    ICD-10-CM   1. MDD (major depressive disorder), recurrent episode, mild (HCC) F33.0 buPROPion (WELLBUTRIN XL) 300 MG 24 hr tablet  2. GAD (generalized anxiety disorder) F41.1     Past Psychiatric History: Reviewed. Patient took Prozac from 2005-2013 until got pregnant.  She tried briefly Lexapro but causes side effects.  No history of psychiatric inpatient treatment, suicidal attempt, mania or psychosis.  Past Medical History:  Past Medical History:  Diagnosis Date  . Abnormal Pap smear   . AMA (advanced maternal age) multigravida 20+   . Anxiety   . Gestational diabetes    diet controlled  . H/O varicella   . HSV (herpes simplex virus) anogenital infection   . PONV (postoperative nausea and vomiting)     Past Surgical History:  Procedure Laterality Date  . CESAREAN SECTION  02/27/2012   Procedure: CESAREAN SECTION;  Surgeon: Linda Hedges, DO;  Location: [facility];   Service: Obstetrics;  Laterality: N/A;  Primary cesarean section with delivery of baby  girl at 9.  Apgars9/9.  [facility]land Kitchen HERNIA REPAIR  1974  . LEEP    . TONSILLECTOMY  1993    Family Psychiatric History: Reviewed.  Family History:  Family History  Problem Relation Age of Onset  . Heart disease Paternal Uncle   . Diabetes Maternal Grandfather   . Heart disease Maternal Grandfather   . Heart attack Paternal Grandmother   . Heart attack Paternal Grandfather   . Stroke Paternal Grandfather   . Heart attack Father     Social History:  Social History   Socioeconomic History  . Marital status: Married    Spouse name: Not on file  . Number of children: Not on file  . Years of education: Not on file  . Highest education level: Not on file  Occupational History  . Not on file  Social Needs  . Financial resource strain: Not on file  . Food insecurity:    Worry: Not on file    Inability: Not on file  . Transportation needs:    Medical: Not on file    Non-medical: Not on file  Tobacco Use  . Smoking status: Never Smoker  . Smokeless tobacco: Never Used  Substance and Sexual Activity  . Alcohol use: No  . Drug use: No  . Sexual activity: Yes    Birth  control/protection: None  Lifestyle  . Physical activity:    Days per week: Not on file    Minutes per session: Not on file  . Stress: Not on file  Relationships  . Social connections:    Talks on phone: Not on file    Gets together: Not on file    Attends religious service: Not on file    Active member of club or organization: Not on file    Attends meetings of clubs or organizations: Not on file    Relationship status: Not on file  Other Topics Concern  . Not on file  Social History Narrative  . Not on file    Allergies:  Allergies  Allergen Reactions  . Codeine Anaphylaxis    Pt said childhood rxn with throat swelling per her mother, tolerated morphine in the OR w/o any problems.  Md said ok to try dilaudid  .  Sulfa Drugs Cross Reactors Anaphylaxis  . Penicillins     Doesn't seem to work    Metabolic Disorder Labs: No results found for: HGBA1C, MPG No results found for: PROLACTIN Lab Results  Component Value Date   CHOL 158 02/16/2016   TRIG 40 02/16/2016   HDL 69 02/16/2016   CHOLHDL 2.3 02/16/2016   VLDL 8 02/16/2016   LDLCALC 81 02/16/2016   No results found for: TSH  Therapeutic Level Labs: No results found for: LITHIUM No results found for: VALPROATE No components found for:  CBMZ  Current Medications: Current Outpatient Medications  Medication Sig Dispense Refill  . buPROPion (WELLBUTRIN XL) 300 MG 24 hr tablet Take 1 tablet (300 mg total) by mouth daily. 90 tablet 0  . LARIN FE 1/20 1-20 MG-MCG tablet Take 1 tablet by mouth daily.  3  . Multiple Vitamins-Minerals (MULTIVITAMIN ADULT PO) Take by mouth.     No current facility-administered medications for this visit.      Musculoskeletal: Strength & Muscle Tone: within normal limits Gait & Station: normal Patient leans: N/A  Psychiatric Specialty Exam: ROS  Blood pressure (!) 160/92, pulse 99, height 5\' 3"  (1.6 m), weight 112 lb (50.8 kg), SpO2 99 %.Body mass index is 19.84 kg/m.  General Appearance: Casual  Eye Contact:  Good  Speech:  Clear and Coherent  Volume:  Normal  Mood:  Euthymic  Affect:  Appropriate  Thought Process:  Goal Directed  Orientation:  Full (Time, Place, and Person)  Thought Content: Logical   Suicidal Thoughts:  No  Homicidal Thoughts:  No  Memory:  Immediate;   Good Recent;   Good Remote;   Good  Judgement:  Good  Insight:  Good  Psychomotor Activity:  Normal  Concentration:  Concentration: Good and Attention Span: Good  Recall:  Good  Fund of Knowledge: Good  Language: Good  Akathisia:  No  Handed:  Right  AIMS (if indicated): not done  Assets:  Communication Skills Desire for Improvement Housing Social Support Talents/Skills  ADL's:  Intact  Cognition: WNL  Sleep:   Good   Screenings:   Assessment and Plan: Major depressive disorder, recurrent.  Generalized anxiety disorder.  Patient is a stable on her current medication.  Continue Wellbutrin XL 300 mg daily.  She has no suicidal thoughts or any crying spells.  Continue therapy with Charolotte Eke.  Reminded that once she see primary care physician have her blood work faxed to [name].  I will see her again in 3 months.   [name], MD 03/07/2018, 10:35 AM

## 2018-03-08 ENCOUNTER — Ambulatory Visit (HOSPITAL_COMMUNITY): Payer: No Typology Code available for payment source | Admitting: Licensed Clinical Social Worker

## 2018-03-27 MED FILL — buPROPion HCL ER (XL) 300 M: 300 | 90 days supply | Qty: 90 | Fill #0

## 2018-04-03 ENCOUNTER — Ambulatory Visit (INDEPENDENT_AMBULATORY_CARE_PROVIDER_SITE_OTHER): Payer: No Typology Code available for payment source | Admitting: Licensed Clinical Social Worker

## 2018-04-03 DIAGNOSIS — F330 Major depressive disorder, recurrent, mild: Secondary | ICD-10-CM | POA: Diagnosis not present

## 2018-04-04 ENCOUNTER — Encounter (HOSPITAL_COMMUNITY): Payer: Self-pay | Admitting: Licensed Clinical Social Worker

## 2018-04-04 NOTE — Progress Notes (Signed)
   THERAPIST PROGRESS NOTE  Session Time: 9:10-10am  Participation Level: Active  Behavioral Response: Casual/Alert/Anxious  Type of Therapy: Individual Therapy  Treatment Goals addressed: Improve Psychiatric Symptoms, elevate mood (increased self-esteem, increased self-compassion, increased interaction), improve unhelpful thought patterns, controlled behavior, moderate mood, deliberate speech and thought process(improved social functioning, healthy adjustment to living situation), Learn about diagnosis, healthy coping skills.  Interventions: CBT  Summary: Donna Phillips is a 48 y.o. female who presents for her individual counseling session. Pt discussed her psychiatric symptoms and current life events. Pt reports her moods have become more stable but she still continues to have some depressive symptoms. She saw Dr. Adele Schilder, psychiatrist in December. Her meds were not changed and continues on Wellbutrin. Discussed parenting skills with pt and cognitive thinking of a child. Educated pt on punishment of a 57 yo with few cognitive skills. Pt is doing well with follow through with daughter. Her husband has been more supportive of her parenting skills. Discussed with pt effective coping skills when she becomes frustrated with her daughter's behaviors. Pt has heard nothing about fostering a child. Discussed her feelings about the possibility and again validated her feelings. Continue to encourage pt to find her own place in life. She needs a plan to follow to fulfill herself. Pt continues to fill in her day with working out and seeing friends on occasion.         Suicidal/Homicidal: Nowithout intent/plan  Therapist Response: Assessed pt's current functioning and reviewed progress. Assisted pt processing  Fostering/adopting children, self care, parenting skills.  Asisted pt processing  the management of her stressors.   Plan: Return again in 2 weeks anger, future plans  Diagnosis: Axis I: MDD,  recurrent episode mild    MACKENZIE,LISBETH S, LCAS 04/04/18

## 2018-04-18 MED FILL — LARIN FE 1.5-30 TABLET: 1.5-30 | 56 days supply | Qty: 56 | Fill #4

## 2018-04-19 ENCOUNTER — Ambulatory Visit (INDEPENDENT_AMBULATORY_CARE_PROVIDER_SITE_OTHER): Payer: No Typology Code available for payment source | Admitting: Licensed Clinical Social Worker

## 2018-04-19 ENCOUNTER — Encounter (HOSPITAL_COMMUNITY): Payer: Self-pay | Admitting: Licensed Clinical Social Worker

## 2018-04-19 DIAGNOSIS — F330 Major depressive disorder, recurrent, mild: Secondary | ICD-10-CM

## 2018-04-19 NOTE — Progress Notes (Signed)
   THERAPIST PROGRESS NOTE  Session Time: 9:10-10am  Participation Level: Active  Behavioral Response: Casual/Alert/Anxious  Type of Therapy: Individual Therapy  Treatment Goals addressed: Improve Psychiatric Symptoms, elevate mood (increased self-esteem, increased self-compassion, increased interaction), improve unhelpful thought patterns, controlled behavior, moderate mood, deliberate speech and thought process(improved social functioning, healthy adjustment to living situation), Learn about diagnosis, healthy coping skills.  Interventions: CBT  Summary: Donna Phillips is a 48 y.o. female who presents for her individual counseling session. Pt discussed her psychiatric symptoms and current life events. Pt reports her moods have become more stable but she still continues to have some depressive symptoms. She does feel her meds are working. Pt continues to isolate more and spend time with her daughter only. Her husband is in grad school and unavailable most nights/weekends. Educated pt on the importance of "date nights" with husband, importance of widening her relationship circle and what isolation can lead to. Discussed basic parenting skills with pt.         Suicidal/Homicidal: Nowithout intent/plan  Therapist Response: Assessed pt'Phillips current functioning and reviewed progress. Assisted pt processing  Isolation, widening relationship circle, date nights with husband, self care, parenting skills.  Asisted pt processing  the management of her stressors.   Plan: Return again in 2 weeks, sibling relationship  Diagnosis: Axis I: MDD, recurrent episode mild    Donna Phillips,Donna Phillips, LCAS 04/19/18

## 2018-05-02 ENCOUNTER — Encounter (HOSPITAL_COMMUNITY): Payer: Self-pay | Admitting: Licensed Clinical Social Worker

## 2018-05-02 ENCOUNTER — Ambulatory Visit (INDEPENDENT_AMBULATORY_CARE_PROVIDER_SITE_OTHER): Payer: No Typology Code available for payment source | Admitting: Licensed Clinical Social Worker

## 2018-05-02 DIAGNOSIS — F330 Major depressive disorder, recurrent, mild: Secondary | ICD-10-CM

## 2018-05-02 NOTE — Progress Notes (Signed)
   THERAPIST PROGRESS NOTE  Session Time: 9:10-10am  Participation Level: Active  Behavioral Response: Casual/Alert/Anxious  Type of Therapy: Individual Therapy  Treatment Goals addressed: Improve Psychiatric Symptoms, elevate mood (increased self-esteem, increased self-compassion, increased interaction), improve unhelpful thought patterns, controlled behavior, moderate mood, deliberate speech and thought process(improved social functioning, healthy adjustment to living situation), Learn about diagnosis, healthy coping skills.  Interventions: CBT/TX plan update   Summary: Donna Phillips is a 48 y.o. female who presents for her individual counseling session. Pt discussed her psychiatric symptoms and current life events. Pt reports her moods have become more stable but she still continues to have some depressive symptoms. She does feel her meds are working. Reviewed tx plan. Pt reports she and husband made a "date night" as suggested. Asked open ended questions. Pt wanted to discuss family dynamics within her own family (siblings, parents). Educated pt on family dynamics. Pt has tenuous relationship with both siblings. Discussed with pt reasons why, future options.     Suicidal/Homicidal: Nowithout intent/plan  Therapist Response: Assessed pt's current functioning and reviewed progress. Assisted pt processing  tx plan review, family dynamics, sibling relationship, date nights with husband.  Asisted pt processing  the management of her stressors.   Plan: Return again in 2 weeks, sibling relationship  Diagnosis: Axis I: MDD, recurrent episode mild    [name] S, LCAS 05/02/2018

## 2018-05-22 ENCOUNTER — Ambulatory Visit (INDEPENDENT_AMBULATORY_CARE_PROVIDER_SITE_OTHER): Payer: No Typology Code available for payment source | Admitting: Licensed Clinical Social Worker

## 2018-05-22 ENCOUNTER — Encounter (HOSPITAL_COMMUNITY): Payer: Self-pay | Admitting: Licensed Clinical Social Worker

## 2018-05-22 DIAGNOSIS — F330 Major depressive disorder, recurrent, mild: Secondary | ICD-10-CM | POA: Diagnosis not present

## 2018-05-22 NOTE — Progress Notes (Signed)
   THERAPIST PROGRESS NOTE  Session Time: 9:10-10am  Participation Level: Active  Behavioral Response: Casual/Alert/Anxious  Type of Therapy: Individual Therapy  Treatment Goals addressed: Improve Psychiatric Symptoms, elevate mood (increased self-esteem, increased self-compassion, increased interaction), improve unhelpful thought patterns, controlled behavior, moderate mood, deliberate speech and thought process(improved social functioning, healthy adjustment to living situation), Learn about diagnosis, healthy coping skills.  Interventions: CBT  Summary: Donna Phillips is a 48 y.o. female who presents for her individual counseling session. Pt discussed her psychiatric symptoms and current life events. Pt reports her moods have become more stable but she still continues to have some depressive days.  Pt and her husband went on date night and she felt more connected. They will continue to work on date nights in the future and both understand the importance. Pt continued the discussion about her family. Her parents are in town for her mother to see a dr and are considering moving to [place] from [place]. Asked open ended questions. Again, discussed family dynamics with pt. Discussed pt's childhood.   Suicidal/Homicidal: Nowithout intent/plan  Therapist Response: Assessed pt's current functioning and reviewed progress. Assisted pt processing family dynamics, sibling relationship, date nights with husband.  Asisted pt processing  the management of her stressors.   Plan: Return again in 2 weeks  Diagnosis: Axis I: MDD, recurrent episode mild    Jerrelle Michelsen S, LCAS 05/22/2018

## 2018-06-05 ENCOUNTER — Ambulatory Visit (INDEPENDENT_AMBULATORY_CARE_PROVIDER_SITE_OTHER): Payer: No Typology Code available for payment source | Admitting: Licensed Clinical Social Worker

## 2018-06-05 DIAGNOSIS — F330 Major depressive disorder, recurrent, mild: Secondary | ICD-10-CM | POA: Diagnosis not present

## 2018-06-06 ENCOUNTER — Encounter (HOSPITAL_COMMUNITY): Payer: Self-pay | Admitting: Psychiatry

## 2018-06-06 ENCOUNTER — Ambulatory Visit (INDEPENDENT_AMBULATORY_CARE_PROVIDER_SITE_OTHER): Payer: No Typology Code available for payment source | Admitting: Psychiatry

## 2018-06-06 ENCOUNTER — Encounter (HOSPITAL_COMMUNITY): Payer: Self-pay | Admitting: Licensed Clinical Social Worker

## 2018-06-06 VITALS — BP 150/84 | HR 115 | Ht 63.0 in | Wt 113.8 lb

## 2018-06-06 DIAGNOSIS — F330 Major depressive disorder, recurrent, mild: Secondary | ICD-10-CM

## 2018-06-06 DIAGNOSIS — F411 Generalized anxiety disorder: Secondary | ICD-10-CM | POA: Diagnosis not present

## 2018-06-06 MED ORDER — BUPROPION HCL ER (XL) 300 MG PO TB24: 300.0000 mg | ORAL_TABLET | Freq: Every day | ORAL | 0 refills | Status: DC

## 2018-06-06 MED FILL — buPROPion HCL ER (XL) 300 M: 300 | 90 days supply | Qty: 90 | Fill #0

## 2018-06-06 NOTE — Progress Notes (Signed)
BH MD/PA/NP OP Progress Note  06/06/2018 10:08 AM [name]  MRN:  [id number]  Chief Complaint: I am doing good.  I had a good Christmas.  I am taking the medication.  HPI: Donna Phillips came for her appointment.  She is taking Wellbutrin XL 300 mg daily.  She denies any anxiety, panic attack or any irritability.  She is sleeping good.  She had a good Christmas.  Her daughter Donna Phillips is now in elementary school.  Today her blood pressure is slightly increased which consistent with her previous appointment.  Patient told that since she had birth to [place] she does get time to time high blood pressure.  She was diagnosed with preeclampsia.  However she also told that sometimes she takes a blood pressure reading at home which is not as high.  I encouraged she should discuss this issue with her primary care physician.  Today she also brought blood work results from Dr. Ria Comment office.  She had blood work on March 31, 2018.  Her creatinine is 1.1, glucose 92, liver function test normal, lipid panel normal, TSH normal and CBC is also normal.  Patient lives with her husband who is very supportive.  Patient denies drinking or using any illegal substances.  She is seeing Charolotte Eke for therapy.  Her energy level is good.    Visit Diagnosis:    ICD-10-CM   1. GAD (generalized anxiety disorder) F41.1   2. MDD (major depressive disorder), recurrent episode, mild (HCC) F33.0 buPROPion (WELLBUTRIN XL) 300 MG 24 hr tablet    Past Psychiatric History: Reviewed. H/O depression and anxiety.  Took Prozac from 2005-2013 until got pregnant. Tried briefly Lexapro but causes side effects. No h/o inpatient treatment, suicidal attempt or mania.   Past Medical History:  Past Medical History:  Diagnosis Date  . Abnormal Pap smear   . AMA (advanced maternal age) multigravida 43+   . Anxiety   . Gestational diabetes    diet controlled  . H/O varicella   . HSV (herpes simplex virus) anogenital infection   . PONV  (postoperative nausea and vomiting)     Past Surgical History:  Procedure Laterality Date  . CESAREAN SECTION  02/27/2012   Procedure: CESAREAN SECTION;  Surgeon: Linda Hedges, DO;  Location: [facility];  Service: Obstetrics;  Laterality: N/A;  Primary cesarean section with delivery of baby  girl at 37.  Apgars9/9.  Marland Kitchen HERNIA REPAIR  1974  . LEEP    . TONSILLECTOMY  1993    Family Psychiatric History: Reviewed.  Family History:  Family History  Problem Relation Age of Onset  . Heart disease Paternal Uncle   . Diabetes Maternal Grandfather   . Heart disease Maternal Grandfather   . Heart attack Paternal Grandmother   . Heart attack Paternal Grandfather   . Stroke Paternal Grandfather   . Heart attack Father     Social History:  Social History   Socioeconomic History  . Marital status: Married    Spouse name: Not on file  . Number of children: Not on file  . Years of education: Not on file  . Highest education level: Not on file  Occupational History  . Not on file  Social Needs  . Financial resource strain: Not on file  . Food insecurity:    Worry: Not on file    Inability: Not on file  . Transportation needs:    Medical: Not on file    Non-medical: Not on file  Tobacco Use  .  Smoking status: Never Smoker  . Smokeless tobacco: Never Used  Substance and Sexual Activity  . Alcohol use: No  . Drug use: No  . Sexual activity: Yes    Birth control/protection: None  Lifestyle  . Physical activity:    Days per week: Not on file    Minutes per session: Not on file  . Stress: Not on file  Relationships  . Social connections:    Talks on phone: Not on file    Gets together: Not on file    Attends religious service: Not on file    Active member of club or organization: Not on file    Attends meetings of clubs or organizations: Not on file    Relationship status: Not on file  Other Topics Concern  . Not on file  Social History Narrative  . Not on file     Allergies:  Allergies  Allergen Reactions  . Codeine Anaphylaxis    Pt said childhood rxn with throat swelling per her mother, tolerated morphine in the OR w/o any problems.  Md said ok to try dilaudid  . Sulfa Drugs Cross Reactors Anaphylaxis  . Penicillins     Doesn't seem to work    Metabolic Disorder Labs: No results found for: HGBA1C, MPG No results found for: PROLACTIN Lab Results  Component Value Date   CHOL 158 02/16/2016   TRIG 40 02/16/2016   HDL 69 02/16/2016   CHOLHDL 2.3 02/16/2016   VLDL 8 02/16/2016   LDLCALC 81 02/16/2016   No results found for: TSH  Therapeutic Level Labs: No results found for: LITHIUM No results found for: VALPROATE No components found for:  CBMZ  Current Medications: Current Outpatient Medications  Medication Sig Dispense Refill  . buPROPion (WELLBUTRIN XL) 300 MG 24 hr tablet Take 1 tablet (300 mg total) by mouth daily. 90 tablet 0  . LARIN FE 1/20 1-20 MG-MCG tablet Take 1 tablet by mouth daily.  3  . Multiple Vitamins-Minerals (MULTIVITAMIN ADULT PO) Take by mouth.     No current facility-administered medications for this visit.      Musculoskeletal: Strength & Muscle Tone: within normal limits Gait & Station: normal Patient leans: N/A  Psychiatric Specialty Exam: ROS  Blood pressure (!) 150/84, pulse (!) 115, height 5\' 3"  (1.6 m), weight 113 lb 12.8 oz (51.6 kg).Body mass index is 20.16 kg/m.  General Appearance: Casual and Well Groomed  Eye Contact:  Good  Speech:  Clear and Coherent  Volume:  Normal  Mood:  Pleasant  Affect:  Appropriate  Thought Process:  Goal Directed  Orientation:  Full (Time, Place, and Person)  Thought Content: Logical   Suicidal Thoughts:  No  Homicidal Thoughts:  No  Memory:  Immediate;   Good Recent;   Good Remote;   Good  Judgement:  Good  Insight:  Good  Psychomotor Activity:  Normal  Concentration:  Concentration: Good and Attention Span: Good  Recall:  Good  Fund of  Knowledge: Good  Language: Good  Akathisia:  No  Handed:  Right  AIMS (if indicated): not done  Assets:  Communication Skills Desire for Improvement Housing Resilience Social Support Transportation  ADL's:  Intact  Cognition: WNL  Sleep:  Good   Screenings:   Assessment and Plan: Major depressive disorder, recurrent.  Generalized anxiety disorder.  Patient is a stable on her current medication.  She has no side effects.  Discuss mild elevation of blood pressure.  Recommended to check with primary care  physician if high blood pressure continue to persist.  She has no other symptoms like headaches, chest pain, sweating, shortness of breath.  I reviewed blood work results which was done in January 2020.  Labs are normal.  Continue therapy with Charolotte Eke.  Recommended to call us back if she has any question or any concern.  Follow-up in 3 months.   [name], MD 06/06/2018, 10:08 AM

## 2018-06-06 NOTE — Progress Notes (Signed)
   THERAPIST PROGRESS NOTE  Session Time: 9:10-10am  Participation Level: Active  Behavioral Response: Casual/Alert/Anxious  Type of Therapy: Individual Therapy  Treatment Goals addressed: Improve Psychiatric Symptoms, elevate mood (increased self-esteem, increased self-compassion, increased interaction), improve unhelpful thought patterns, controlled behavior, moderate mood, deliberate speech and thought process(improved social functioning, healthy adjustment to living situation), Learn about diagnosis, healthy coping skills.  Interventions: CBT  Summary: Donna Phillips is a 48 y.o. female who presents for her individual counseling session. Pt discussed her psychiatric symptoms and current life events. Pt reports her moods have become more stable. Patient has appt with her psychiatrist, Dr. Adele Schilder tomorrow. Discussed what she may want to discuss with him.  Pt's mother has been cleared for surgery here in [place] and are making plans to move permanently to [place]. Pt continued her discussion about her sibling relationships and it's effects on her family. Educated pt on relationship circles and how she chooses who is in her inner circle. Gave pt self-soothing handout to use for calming when feeling overwhelmed emotionally.   Suicidal/Homicidal: Nowithout intent/plan  Therapist Response: Assessed pt's current functioning and reviewed progress. Assisted pt processing family dynamics, sibling relationship, mother's surgery, relationship circles, self soothing.  Asisted pt processing  the management of her stressors.   Plan: Return again in 2 weeks  Diagnosis: Axis I: MDD, recurrent episode mild    Jaelani Posa S, LCAS 06/05/2018

## 2018-06-15 MED FILL — LARIN FE 1.5-30 TABLET: 1.5-30 | 84 days supply | Qty: 84 | Fill #0

## 2018-06-19 ENCOUNTER — Ambulatory Visit (HOSPITAL_COMMUNITY): Payer: No Typology Code available for payment source | Admitting: Licensed Clinical Social Worker

## 2018-07-10 ENCOUNTER — Other Ambulatory Visit: Payer: Self-pay

## 2018-07-10 ENCOUNTER — Ambulatory Visit (INDEPENDENT_AMBULATORY_CARE_PROVIDER_SITE_OTHER): Payer: No Typology Code available for payment source | Admitting: Licensed Clinical Social Worker

## 2018-07-10 ENCOUNTER — Encounter (HOSPITAL_COMMUNITY): Payer: Self-pay | Admitting: Licensed Clinical Social Worker

## 2018-07-10 DIAGNOSIS — F330 Major depressive disorder, recurrent, mild: Secondary | ICD-10-CM | POA: Diagnosis not present

## 2018-07-10 NOTE — Progress Notes (Signed)
Virtual Visit via Video Note  I connected with Donna Phillips on 07/10/18 at  2:00 PM EDT by a video enabled telemedicine application and verified that I am speaking with the correct person using two identifiers.   I discussed the limitations of evaluation and management by telemedicine and the availability of in person appointments. The patient expressed understanding and agreed to proceed.  History of Present Illness: Pt was referred to therapy by her psychiatrist for depressive symptoms.    Observations/Objective: Reduce irritability. 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 I'm handling the stress well.My husband is still working and Mining engineer school. " Discussed her support. "I am my own support, that and my husband." Pt discussed her family dysfunction. She has recently found some information about her sister and $. Asked open ended questions. Processed family dysfunction, moving forward with family relationships, benefits of family relationships.  Assessment and Plan: Continue to see pt via webex.   Follow Up Instructions:    I discussed the assessment and treatment plan with the patient. The patient was provided an opportunity to ask questions and all were answered. The patient agreed with the plan and demonstrated an understanding of the instructions.   The patient was advised to call back or seek an in-person evaluation if the symptoms worsen or if the condition fails to improve as anticipated.  I provided 50 minutes of non-face-to-face time during this encounter.   Donna Phillips S, LCAS

## 2018-07-11 ENCOUNTER — Ambulatory Visit (HOSPITAL_COMMUNITY): Payer: No Typology Code available for payment source | Admitting: Licensed Clinical Social Worker

## 2018-07-31 ENCOUNTER — Encounter (HOSPITAL_COMMUNITY): Payer: Self-pay | Admitting: Licensed Clinical Social Worker

## 2018-07-31 ENCOUNTER — Other Ambulatory Visit: Payer: Self-pay

## 2018-07-31 ENCOUNTER — Ambulatory Visit (INDEPENDENT_AMBULATORY_CARE_PROVIDER_SITE_OTHER): Payer: No Typology Code available for payment source | Admitting: Licensed Clinical Social Worker

## 2018-07-31 DIAGNOSIS — F330 Major depressive disorder, recurrent, mild: Secondary | ICD-10-CM

## 2018-07-31 NOTE — Progress Notes (Signed)
Virtual Visit via Video Note  I connected with Donna Phillips on 07/31/18 at  2:00 PM EDT by a video enabled telemedicine application and verified that I am speaking with the correct person using two identifiers.   I discussed the limitations of evaluation and management by telemedicine and the availability of in person appointments. The patient expressed understanding and agreed to proceed.  History of Present Illness: Pt was referred to therapy by her psychiatrist for depressive symptoms.    Observations/Objective:   Assessment and Plan: Continue to see pt via webex.   Follow Up Instructions:    I discussed the assessment and treatment plan with the patient. The patient was provided an opportunity to ask questions and all were answered. The patient agreed with the plan and demonstrated an understanding of the instructions.   The patient was advised to call back or seek an in-person evaluation if the symptoms worsen or if the condition fails to improve as anticipated.  I provided 60 minutes of non-face-to-face time during this encounter.   MACKENZIE,LISBETH S, LCAS

## 2018-07-31 NOTE — Progress Notes (Signed)
Comprehensive Clinical Assessment (CCA) Note  07/31/2018 Donna Phillips [id number]  Visit Diagnosis:      ICD-10-CM   1. MDD (major depressive disorder), recurrent episode, mild ([place]) F33.0     2. GAD  CCA Part One  Part One has been completed on paper by the patient.  (See scanned document in Chart Review)  CCA Part Two A  Intake/Chief Complaint:  CCA Intake With Chief Complaint CCA Part Two Date: 07/31/18 CCA Part Two Time: 3086 Chief Complaint/Presenting Problem: Dr. Adele Schilder referring pt for therapy. No previous psychiatrist and limited therapy. Depressive symptoms Patients Currently Reported Symptoms/Problems: Pt's depressive and anxiety symptoms have lessessend Collateral Involvement: psychiatrisit and therapy notes Individual's Strengths: supportive family Individual's Preferences: prefers to continue with stabilized moods Individual's Abilities: ability to continue feeling better Type of Services Patient Feels Are Needed: therapy  Mental Health Symptoms Depression:  Depression: N/A  Mania:  Mania: N/A  Anxiety:   Anxiety: (anxious due to homeschooling daughter)  Psychosis:  Psychosis: N/A  Trauma:  Trauma: N/A  Obsessions:  Obsessions: N/A  Compulsions:  Compulsions: N/A  Inattention:  Inattention: N/A  Hyperactivity/Impulsivity:  Hyperactivity/Impulsivity: N/A  Oppositional/Defiant Behaviors:  Oppositional/Defiant Behaviors: N/A  Borderline Personality:  Emotional Irregularity: N/A  Other Mood/Personality Symptoms:      Mental Status Exam Appearance and self-care  Stature:  Stature: Average  Weight:  Weight: Average weight  Clothing:  Clothing: Casual  Grooming:  Grooming: Normal  Cosmetic use:  Cosmetic Use: None  Posture/gait:  Posture/Gait: Normal  Motor activity:  Motor Activity: Not Remarkable  Sensorium  Attention:  Attention: Normal  Concentration:  Concentration: Normal  Orientation:  Orientation: X5  Recall/memory:  Recall/Memory: Normal  Affect  and Mood  Affect:  Affect: Appropriate  Mood:  Mood: Euthymic  Relating  Eye contact:  Eye Contact: Normal  Facial expression:  Facial Expression: Responsive  Attitude toward examiner:  Attitude Toward Examiner: Cooperative  Thought and Language  Speech flow: Speech Flow: Normal  Thought content:  Thought Content: Appropriate to mood and circumstances  Preoccupation:     Hallucinations:     Organization:     Transport planner of Knowledge:  Fund of Knowledge: Average  Intelligence:  Intelligence: Above [employer]  Abstraction:  Abstraction: Normal  Judgement:  Judgement: Normal  Reality Testing:  Reality Testing: Adequate  Insight:  Insight: Good  Decision Making:  Decision Making: Normal  Social Functioning  Social Maturity:  Social Maturity: Responsible  Social Judgement:  Social Judgement: Normal  Stress  Stressors:  Stressors: Family conflict  Coping Ability:  Coping Ability: Deficient supports  Skill Deficits:     Supports:      Family and Psychosocial History: Family history Marital status: Married Number of Years Married: 8 What types of issues is patient dealing with in the relationship?: husband works full-time job and in grad school Does patient have children?: Yes How many children?: 1 How is patient's relationship with their children?: her relationship with her daughter has improved since engaging in therapy  Childhood History:  Childhood History By whom was/is the patient raised?: Both parents Additional childhood history information: good middle class upbringing Description of patient's relationship with caregiver when they were a child: very good Patient's description of current relationship with people who raised him/her: excellent How were you disciplined when you got in trouble as a child/adolescent?: didn't really act up, mom gave [name] a look and that was enogh Does patient have siblings?: Yes Number of Siblings: 2 Description  of patient's current  relationship with siblings: brother lives in [place] and sister lives in [place], relationships have grown further apart Did patient suffer any verbal/emotional/physical/sexual abuse as a child?: No Did patient suffer from severe childhood neglect?: No Has patient ever been sexually abused/assaulted/raped as an adolescent or adult?: No Was the patient ever a victim of a crime or a disaster?: No Witnessed domestic violence?: No Has patient been effected by domestic violence as an adult?: No  CCA Part Two B  Employment/Work Situation: Employment / Work Copywriter, advertising Employment situation: Unemployed What is the longest time patient has a held a job?: 7 years 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 Are There Guns or Chiropractor in [place]?: No  Education: Education Last Grade Completed: 16 Did Teacher, adult education From [employer]?: Yes Did Physicist, medical?: Yes What Type of College Degree Do you Have?: BA Rehab services/psychology Did [employer] Attend Graduate School?: No Did You Have An Individualized Education Program (IIEP): No Did You Have Any Difficulty At School?: No  Religion: Religion/Spirituality Are You A Religious Person?: Yes  Leisure/Recreation: Leisure / Recreation Leisure and Hobbies: read, dance, sing,   Exercise/Diet: Exercise/Diet Do You Exercise?: Yes What Type of Exercise Do You Do?: Weight Training How Many Times a Week Do You Exercise?: 6-7 times a week Have You Gained or Lost A Significant Amount of Weight in the Past Six Months?: No Do You Follow a Special Diet?: No  CCA Part Two C  Alcohol/Drug Use: Alcohol / Drug Use History of alcohol / drug use?: No history of alcohol / drug abuse                      CCA Part Three  ASAM's:  Six Dimensions of Multidimensional Assessment  Dimension 1:  Acute Intoxication and/or Withdrawal Potential:     Dimension 2:  Biomedical Conditions and Complications:     Dimension 3:  Emotional,  Behavioral, or Cognitive Conditions and Complications:     Dimension 4:  Readiness to Change:     Dimension 5:  Relapse, Continued use, or Continued Problem Potential:     Dimension 6:  Recovery/Living Environment:      Substance use Disorder (SUD)    Social Function:  Social Functioning Social Maturity: Responsible Social Judgement: Normal  Stress:  Stress Stressors: Family conflict Coping Ability: Deficient supports Patient Takes Medications The Way The Doctor Instructed?: Yes Priority Risk: Low Acuity  Risk Assessment- Self-Harm Potential: Risk Assessment For Self-Harm Potential Thoughts of Self-Harm: No current thoughts Method: No plan Availability of Means: No access/NA  Risk Assessment -Dangerous to Others Potential: Risk Assessment For Dangerous to Others Potential Method: No Plan Availability of Means: No access or NA Intent: Vague intent or NA Notification Required: No need or identified person  DSM5 Diagnoses: Patient Active Problem List   Diagnosis Date Noted  . MDD (major depressive disorder), recurrent episode, mild ([place]) 06/22/2017  . Ganglion cyst of dorsum of right wrist 05/11/2017    Patient Centered Plan: Patient is on the following Treatment Plan(s): Reviewed tx plan today 07/31/18  Recommendations for Services/Supports/Treatments: Recommendations for Services/Supports/Treatments Recommendations For Services/Supports/Treatments: Individual Therapy, Medication Management  Treatment Plan Summary:    Referrals to Alternative Service(s): Referred to Alternative Service(s):   Place:   Date:   Time:    Referred to Alternative Service(s):   Place:   Date:   Time:    Referred to Alternative Service(s):   Place:  Date:   Time:    Referred to Alternative Service(s):   Place:   Date:   Time:     I provided 60 minutes of non-face-to-face time during this encounter.  MACKENZIE,LISBETH S

## 2018-08-31 ENCOUNTER — Other Ambulatory Visit: Payer: Self-pay

## 2018-08-31 ENCOUNTER — Encounter (HOSPITAL_COMMUNITY): Payer: Self-pay | Admitting: Licensed Clinical Social Worker

## 2018-08-31 ENCOUNTER — Ambulatory Visit (INDEPENDENT_AMBULATORY_CARE_PROVIDER_SITE_OTHER): Payer: No Typology Code available for payment source | Admitting: Licensed Clinical Social Worker

## 2018-08-31 DIAGNOSIS — F330 Major depressive disorder, recurrent, mild: Secondary | ICD-10-CM | POA: Diagnosis not present

## 2018-08-31 NOTE — Progress Notes (Signed)
Virtual Visit via Video Note  I connected with Donna Phillips on 08/31/18 at  2:30 PM EDT by a video enabled telemedicine application and verified that I am speaking with the correct person using two identifiers.   I discussed the limitations of evaluation and management by telemedicine and the availability of in person appointments. The patient expressed understanding and agreed to proceed.  History of Present Illness: Pt was referred to therapy by her psychiatrist for depressive symptoms.    Observations/Objective: Reduce irritability. Pt presented wnl today for her webex therapy session. Pt discussed her psychiatric symptoms and current life events. "My moods have improved since my daughter's school is out." Pt and her family are on the way to [facility] for vacation. Her parents are moving to [place], Medical illustrator from [place]. Her mother will have knee replacement surgery at the end of June. Pt will be her caregiver. Discussed pt's feelings about caregiver status and supported her on her choice and management of her stressors.  Pt discussed her stress of the continued pandemic and now the protesting/curfew in the city. Assisted pt with calming skills for her current and upcoming stressors.   Assessment and Plan: Continue to see pt via webex.   Follow Up Instructions:    I discussed the assessment and treatment plan with the patient. The patient was provided an opportunity to ask questions and all were answered. The patient agreed with the plan and demonstrated an understanding of the instructions.   The patient was advised to call back or seek an in-person evaluation if the symptoms worsen or if the condition fails to improve as anticipated.  I provided 60 minutes of non-face-to-face time during this encounter.   Shamanda Len S, LCAS

## 2018-09-06 ENCOUNTER — Encounter (HOSPITAL_COMMUNITY): Payer: Self-pay | Admitting: Psychiatry

## 2018-09-06 ENCOUNTER — Ambulatory Visit (INDEPENDENT_AMBULATORY_CARE_PROVIDER_SITE_OTHER): Payer: No Typology Code available for payment source | Admitting: Psychiatry

## 2018-09-06 ENCOUNTER — Other Ambulatory Visit: Payer: Self-pay

## 2018-09-06 DIAGNOSIS — F330 Major depressive disorder, recurrent, mild: Secondary | ICD-10-CM | POA: Diagnosis not present

## 2018-09-06 DIAGNOSIS — F411 Generalized anxiety disorder: Secondary | ICD-10-CM | POA: Diagnosis not present

## 2018-09-06 MED ORDER — BUPROPION HCL ER (XL) 300 MG PO TB24: 300.0000 mg | ORAL_TABLET | Freq: Every day | ORAL | 0 refills | Status: DC

## 2018-09-06 MED FILL — buPROPion HCL ER (XL) 300 M: 300 | 90 days supply | Qty: 90 | Fill #0

## 2018-09-06 NOTE — Progress Notes (Signed)
Virtual Visit via Telephone Note  I connected with Donna Phillips on 09/06/18 at 10:20 AM EDT by telephone and verified that I am speaking with the correct person using two identifiers.   I discussed the limitations, risks, security and privacy concerns of performing an evaluation and management service by telephone and the availability of in person appointments. I also discussed with the patient that there may be a patient responsible charge related to this service. The patient expressed understanding and agreed to proceed.   History of Present Illness: Patient was evaluated by phone session.  She is taking Wellbutrin XL 300 mg without any side effects.  She feels her depression anxiety is much better.  Patient lives with her husband and her daughter Donna Phillips.  Patient told her daughter does not like on line schooling like to go back to school.  Overall she describes her mood is stable and denies any crying spells, irritability, feeling of hopelessness or worthlessness.  Her appetite is okay.  Her energy level is good.  She is getting therapy from [facility].  She denies any tremors, shakes or any EPS.  She like to continue Wellbutrin which is helping her depression and anxiety.  Past Psychiatric History: Reviewed. H/O depression and anxiety.  Took Prozac from 2005-2013 untilgot pregnant. Tried briefly Lexapro but causes side effects.Noh/o inpatient treatment,suicidal attempt or mania.     Psychiatric Specialty Exam: Physical Exam  ROS  There were no vitals taken for this visit.There is no height or weight on file to calculate BMI.  General Appearance: NA  Eye Contact:  NA  Speech:  Clear and Coherent and Normal Rate  Volume:  Normal  Mood:  Euthymic  Affect:  NA  Thought Process:  Goal Directed  Orientation:  Full (Time, Place, and Person)  Thought Content:  WDL  Suicidal Thoughts:  No  Homicidal Thoughts:  No  Memory:  Immediate;   Good Recent;   Good Remote;   Good  Judgement:   Good  Insight:  Good  Psychomotor Activity:  NA  Concentration:  Concentration: Good and Attention Span: Good  Recall:  Good  Fund of Knowledge:  Good  Language:  Good  Akathisia:  No  Handed:  Right  AIMS (if indicated):     Assets:  Communication Skills Desire for Improvement Housing Resilience Social Support Talents/Skills  ADL's:  Intact  Cognition:  WNL  Sleep:   good      Assessment and Plan: Major depressive disorder, recurrent.  Generalized anxiety disorder.  Patient is a stable on her current medication.  She like to continue Wellbutrin XL 300 mg daily.  Discussed medication side effects and benefits.  Encouraged to continue therapy with Beth.  Recommended to call us back if she has any question, concern if she feels worsening of her symptoms.  Follow-up in 3 months.  Follow Up Instructions:    I discussed the assessment and treatment plan with the patient. The patient was provided an opportunity to ask questions and all were answered. The patient agreed with the plan and demonstrated an understanding of the instructions.   The patient was advised to call back or seek an in-person evaluation if the symptoms worsen or if the condition fails to improve as anticipated.  I provided 15 minutes of non-face-to-face time during this encounter.   [name], MD

## 2018-09-11 MED FILL — LARIN FE 1.5-30 TABLET: 1.5-30 | 84 days supply | Qty: 84 | Fill #1

## 2018-10-03 ENCOUNTER — Ambulatory Visit (HOSPITAL_COMMUNITY): Payer: No Typology Code available for payment source | Admitting: Licensed Clinical Social Worker

## 2018-10-10 ENCOUNTER — Encounter (HOSPITAL_COMMUNITY): Payer: Self-pay | Admitting: Licensed Clinical Social Worker

## 2018-10-10 ENCOUNTER — Other Ambulatory Visit: Payer: Self-pay

## 2018-10-10 ENCOUNTER — Ambulatory Visit (INDEPENDENT_AMBULATORY_CARE_PROVIDER_SITE_OTHER): Payer: No Typology Code available for payment source | Admitting: Licensed Clinical Social Worker

## 2018-10-10 DIAGNOSIS — F330 Major depressive disorder, recurrent, mild: Secondary | ICD-10-CM

## 2018-10-11 NOTE — Progress Notes (Signed)
Virtual Visit via Video Note  I connected with Donna Phillips on 10/10/18 at  4:00 PM EDT by a video enabled telemedicine application and verified that I am speaking with the correct person using two identifiers.   I discussed the limitations of evaluation and management by telemedicine and the availability of in person appointments. The patient expressed understanding and agreed to proceed.  History of Present Illness: Pt was referred to therapy by her psychiatrist for depressive symptoms.    Observations/Objective: Reduce irritability. Pt presented wnl today for her webex therapy session. Pt discussed her psychiatric symptoms and current life events. She just had an appointment with Dr. Adele Schilder, psychiatrist. He changed no medications. Pt reports her moods are stable. Pt reports her parents are in the process of moving from [place] here. Pt has been assisting them in the move. Her mother is having knee replacement surgery next week, which pt will assist her mother as her caregiver. Asked open ended questions about the stress of being a caregiver. Coached pt on coping skills for her stressors. Pt reports she and her siblings finally had a conversation to "clear the air."  Pt discussed her family dysfunction, asking for assisitance in problem solving. Coached pt on problem solving.   Assessment and Plan: Continue to see pt via webex.   Follow Up Instructions:    I discussed the assessment and treatment plan with the patient. The patient was provided an opportunity to ask questions and all were answered. The patient agreed with the plan and demonstrated an understanding of the instructions.   The patient was advised to call back or seek an in-person evaluation if the symptoms worsen or if the condition fails to improve as anticipated.  I provided 60 minutes of non-face-to-face time during this encounter.   MACKENZIE,LISBETH S, LCAS

## 2018-11-07 ENCOUNTER — Encounter (HOSPITAL_COMMUNITY): Payer: Self-pay | Admitting: Licensed Clinical Social Worker

## 2018-11-07 ENCOUNTER — Other Ambulatory Visit: Payer: Self-pay

## 2018-11-07 ENCOUNTER — Ambulatory Visit (INDEPENDENT_AMBULATORY_CARE_PROVIDER_SITE_OTHER): Payer: No Typology Code available for payment source | Admitting: Licensed Clinical Social Worker

## 2018-11-07 DIAGNOSIS — F330 Major depressive disorder, recurrent, mild: Secondary | ICD-10-CM | POA: Diagnosis not present

## 2018-11-07 NOTE — Progress Notes (Signed)
Virtual Visit via Video Note  I connected with ALLESHA ARONOFF on 10/10/18 at  2:00 PM EDT by a video enabled telemedicine application and verified that I am speaking with the correct person using two identifiers.   I discussed the limitations of evaluation and management by telemedicine and the availability of in person appointments. The patient expressed understanding and agreed to proceed.  History of Present Illness: Pt was referred to therapy by her psychiatrist for depressive symptoms.    Observations/Objective: Reduce irritability. Pt presented anxious today for her webex therapy session. Pt discussed her psychiatric symptoms and current life events. She reports her moods are somewhat stable with increase in stressor: home school in the fall, mother & father temporary living with her, mother just had knee replacement surgery. Patient appears to be handling her stressors well. Used socratic questions. Pt discussed what coping skills she's using. Explained benefit of mindfulness skills to assist with additional temporary stressors. During session, her daughter continuously jumped in the session.. Pt handled the stress extremely well.Explained the benefit of self care encouraging patient to take care of herself. Made new appointment for 30 days.  PLAN: TX plan review    Assessment and Plan: Continue to see pt via webex.   Follow Up Instructions:    I discussed the assessment and treatment plan with the patient. The patient was provided an opportunity to ask questions and all were answered. The patient agreed with the plan and demonstrated an understanding of the instructions.   The patient was advised to call back or seek an in-person evaluation if the symptoms worsen or if the condition fails to improve as anticipated.  I provided 45 minutes of non-face-to-face time during this encounter.   MACKENZIE,LISBETH S, LCAS

## 2018-12-07 ENCOUNTER — Other Ambulatory Visit: Payer: Self-pay

## 2018-12-07 ENCOUNTER — Encounter (HOSPITAL_COMMUNITY): Payer: Self-pay | Admitting: Psychiatry

## 2018-12-07 ENCOUNTER — Ambulatory Visit (INDEPENDENT_AMBULATORY_CARE_PROVIDER_SITE_OTHER): Payer: No Typology Code available for payment source | Admitting: Psychiatry

## 2018-12-07 DIAGNOSIS — F411 Generalized anxiety disorder: Secondary | ICD-10-CM | POA: Diagnosis not present

## 2018-12-07 DIAGNOSIS — F330 Major depressive disorder, recurrent, mild: Secondary | ICD-10-CM

## 2018-12-07 MED ORDER — BUPROPION HCL ER (XL) 300 MG PO TB24: 300.0000 mg | ORAL_TABLET | Freq: Every day | ORAL | 0 refills | Status: DC

## 2018-12-07 MED FILL — buPROPion HCL ER (XL) 300 M: 300 | 90 days supply | Qty: 90 | Fill #0

## 2018-12-07 NOTE — Progress Notes (Signed)
Virtual Visit via Telephone Note  I connected with Donna Phillips on 12/07/18 at  9:00 AM EDT by telephone and verified that I am speaking with the correct person using two identifiers.   I discussed the limitations, risks, security and privacy concerns of performing an evaluation and management service by telephone and the availability of in person appointments. I also discussed with the patient that there may be a patient responsible charge related to this service. The patient expressed understanding and agreed to proceed.   History of Present Illness: Patient was evaluated by phone session.  She is compliant with Wellbutrin XL 300 mg daily and reported no side effects.  She was concerned about her 2-year-old daughter [name] for home schooling since she just started virtual school.  She did not like and hoping to have school in person.  Overall she describes her mood and anxiety is under control.  She denies any crying spells or any feeling of hopelessness or worthlessness.  She is sleeping good.  She is excited that her husband will complete her masters in March or April next year.  She is sleeping good.  Her energy level is good.  Her appetite is okay.  She reported her weight is stable.  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  She is getting therapy from [facility] once a month with is going very well.  She wants to continue Wellbutrin which is working very well.    Past Psychiatric History:Reviewed. H/Odepression and anxiety.Took Prozac from 2005-2013 untilgot pregnant. Tried briefly Lexapro but causes side effects.Noh/oinpatient treatment,suicidalattempt or mania.    Psychiatric Specialty Exam: Physical Exam  ROS  There were no vitals taken for this visit.There is no height or weight on file to calculate BMI.  General Appearance: NA  Eye Contact:  NA  Speech:  Clear and Coherent  Volume:  Normal  Mood:  Euthymic  Affect:  NA  Thought  Process:  Coherent and Goal Directed  Orientation:  Full (Time, Place, and Person)  Thought Content:  Logical  Suicidal Thoughts:  No  Homicidal Thoughts:  No  Memory:  Immediate;   Good Recent;   Good Remote;   Good  Judgement:  Good  Insight:  Good  Psychomotor Activity:  NA  Concentration:  Concentration: Good and Attention Span: Good  Recall:  Good  Fund of Knowledge:  Good  Language:  Good  Akathisia:  No  Handed:  Right  AIMS (if indicated):     Assets:  Communication Skills Desire for Improvement Housing Resilience Social Support  ADL's:  Intact  Cognition:  WNL  Sleep:   good      Assessment and Plan: Major depressive disorder, recurrent.  Generalized anxiety disorder.  Patient is doing very well on her current medicine.  She has no tremors shakes or any EPS.  Continue Wellbutrin XL 300 mg daily and monthly therapy with Charolotte Eke.  Recommended to call us back if she has any question or any concern.  She has upcoming appointment for physical and blood work in January.  Follow-up in 3 months.  Follow Up Instructions:    I discussed the assessment and treatment plan with the patient. The patient was provided an opportunity to ask questions and all were answered. The patient agreed with the plan and demonstrated an understanding of the instructions.   The patient was advised to call back or seek an in-person evaluation if the symptoms worsen or if the  condition fails to improve as anticipated.  I provided 20 minutes of non-face-to-face time during this encounter.   [name], MD

## 2018-12-11 ENCOUNTER — Ambulatory Visit (INDEPENDENT_AMBULATORY_CARE_PROVIDER_SITE_OTHER): Payer: No Typology Code available for payment source | Admitting: Licensed Clinical Social Worker

## 2018-12-11 ENCOUNTER — Other Ambulatory Visit: Payer: Self-pay

## 2018-12-11 ENCOUNTER — Encounter (HOSPITAL_COMMUNITY): Payer: Self-pay | Admitting: Licensed Clinical Social Worker

## 2018-12-11 DIAGNOSIS — F330 Major depressive disorder, recurrent, mild: Secondary | ICD-10-CM | POA: Diagnosis not present

## 2018-12-11 NOTE — Progress Notes (Signed)
Virtual Visit via Video Note  I connected with Donna Phillips on 10/10/18 at  3:00 PM EDT by a video enabled telemedicine application and verified that I am speaking with the correct person using two identifiers.   I discussed the limitations of evaluation and management by telemedicine and the availability of in person appointments. The patient expressed understanding and agreed to proceed.  History of Present Illness: Pt was referred to therapy by her psychiatrist for depressive symptoms.    Observations/Objective: Reduce irritability. Pt presented anxious today for her webex therapy session. Pt discussed her psychiatric symptoms and current life events. She continue to report her moods are somewhat stable with increase in stressor: home school continues. Pt  appears to be handling her stressors well. Used socratic questions. Pt discussed what coping skills she's using. Reminded patient of the benefit of mindfulness skills to assist with additional stressor. During session, her daughter again jumped in the session.. Pt handled the stress extremely well. Explained the benefit of self care encouraging patient to take care of herself.Pt shared she and her husband are going on a fishing trip the first weekend of October. She is excited to be spending quality time with her husband.  Made new appointment for 30 days.  PLAN:Life Purpse    Assessment and Plan: Continue to see pt via webex.   Follow Up Instructions:    I discussed the assessment and treatment plan with the patient. The patient was provided an opportunity to ask questions and all were answered. The patient agreed with the plan and demonstrated an understanding of the instructions.   The patient was advised to call back or seek an in-person evaluation if the symptoms worsen or if the condition fails to improve as anticipated.  I provided 45 minutes of non-face-to-face time during this encounter.   Darrian Grzelak S, LCAS

## 2019-01-04 ENCOUNTER — Other Ambulatory Visit (HOSPITAL_COMMUNITY): Payer: Self-pay | Admitting: Internal Medicine

## 2019-01-04 DIAGNOSIS — Z1231 Encounter for screening mammogram for malignant neoplasm of breast: Secondary | ICD-10-CM

## 2019-01-08 ENCOUNTER — Ambulatory Visit (INDEPENDENT_AMBULATORY_CARE_PROVIDER_SITE_OTHER): Payer: No Typology Code available for payment source | Admitting: Licensed Clinical Social Worker

## 2019-01-08 ENCOUNTER — Other Ambulatory Visit: Payer: Self-pay

## 2019-01-08 ENCOUNTER — Encounter (HOSPITAL_COMMUNITY): Payer: Self-pay | Admitting: Licensed Clinical Social Worker

## 2019-01-08 DIAGNOSIS — F330 Major depressive disorder, recurrent, mild: Secondary | ICD-10-CM | POA: Diagnosis not present

## 2019-01-08 NOTE — Progress Notes (Signed)
Virtual Visit via Video Note  I connected with Donna Phillips on 01/08/19 at  3:00 PM EDT by a video enabled telemedicine application and verified that I am speaking with the correct person using two identifiers.   I discussed the limitations of evaluation and management by telemedicine and the availability of in person appointments. The patient expressed understanding and agreed to proceed.  History of Present Illness: Pt was referred to therapy by her psychiatrist for depressive symptoms.    Observations/Objective: Reduce irritability. Pt presented WNL today for her webex therapy session. Pt discussed her psychiatric symptoms and current life events. She continue to report her moods are somewhat stable with increase in stressor: home school however kids go back to school next week. Helped patient processing her feelings about her child going back to school (fears vs socialization). With her daughter going back to school, what does that mean for patient's "life purpose?"  Patient still wants to continue to have therapy 1x per month. Pt is in agreement.    PLAN:Life Purpse    Assessment and Plan: Continue to see pt via webex.   Follow Up Instructions:    I discussed the assessment and treatment plan with the patient. The patient was provided an opportunity to ask questions and all were answered. The patient agreed with the plan and demonstrated an understanding of the instructions.   The patient was advised to call back or seek an in-person evaluation if the symptoms worsen or if the condition fails to improve as anticipated.  I provided 45 minutes of non-face-to-face time during this encounter.   MACKENZIE,LISBETH S, LCAS

## 2019-01-17 ENCOUNTER — Ambulatory Visit (HOSPITAL_COMMUNITY)
Admission: RE | Admit: 2019-01-17 | Discharge: 2019-01-17 | Disposition: A | Discharge status: Home / Self Care | Payer: No Typology Code available for payment source | Source: Ambulatory Visit | Attending: Internal Medicine | Admitting: Internal Medicine

## 2019-01-17 ENCOUNTER — Other Ambulatory Visit: Payer: Self-pay

## 2019-01-17 DIAGNOSIS — Z1231 Encounter for screening mammogram for malignant neoplasm of breast: Secondary | ICD-10-CM | POA: Diagnosis not present

## 2019-02-13 ENCOUNTER — Ambulatory Visit (INDEPENDENT_AMBULATORY_CARE_PROVIDER_SITE_OTHER): Payer: No Typology Code available for payment source | Admitting: Licensed Clinical Social Worker

## 2019-02-13 ENCOUNTER — Encounter (HOSPITAL_COMMUNITY): Payer: Self-pay | Admitting: Licensed Clinical Social Worker

## 2019-02-13 ENCOUNTER — Other Ambulatory Visit: Payer: Self-pay

## 2019-02-13 DIAGNOSIS — F330 Major depressive disorder, recurrent, mild: Secondary | ICD-10-CM | POA: Diagnosis not present

## 2019-02-13 NOTE — Progress Notes (Signed)
Virtual Visit via Video Note  I connected with Donna Phillips on 02/13/19 at 10:00am by a video enabled telemedicine application and verified that I am speaking with the correct person using two identifiers.   I discussed the limitations of evaluation and management by telemedicine and the availability of in person appointments. The patient expressed understanding and agreed to proceed.  History of Present Illness: Pt was referred to therapy by her psychiatrist for depressive symptoms.    Observations/Objective: Reduce irritability. Pt presented WNL today for her webex therapy session. Pt discussed her psychiatric symptoms and current life events.Asked open ended questions. Patient reports her moods are stable, her child is back in school. Patient discussed her feelings of her daughter being in school with Covid. Helped patient process her feelings about her child going back to school (fears vs socialization).  Patient still wants to continue to have therapy 1x per month.     PLAN:Life Purpse    Assessment and Plan: Continue to see pt via webex.   Follow Up Instructions:    I discussed the assessment and treatment plan with the patient. The patient was provided an opportunity to ask questions and all were answered. The patient agreed with the plan and demonstrated an understanding of the instructions.   The patient was advised to call back or seek an in-person evaluation if the symptoms worsen or if the condition fails to improve as anticipated.  I provided 45 minutes of non-face-to-face time during this encounter.   Donna Phillips, LCAS

## 2019-03-08 ENCOUNTER — Encounter (HOSPITAL_COMMUNITY): Payer: Self-pay | Admitting: Psychiatry

## 2019-03-08 ENCOUNTER — Ambulatory Visit (INDEPENDENT_AMBULATORY_CARE_PROVIDER_SITE_OTHER): Payer: No Typology Code available for payment source | Admitting: Psychiatry

## 2019-03-08 ENCOUNTER — Other Ambulatory Visit: Payer: Self-pay

## 2019-03-08 DIAGNOSIS — F330 Major depressive disorder, recurrent, mild: Secondary | ICD-10-CM

## 2019-03-08 DIAGNOSIS — F411 Generalized anxiety disorder: Secondary | ICD-10-CM

## 2019-03-08 MED ORDER — BUPROPION HCL ER (XL) 300 MG PO TB24: 300.0000 mg | ORAL_TABLET | Freq: Every day | ORAL | 0 refills | Status: DC

## 2019-03-08 MED FILL — BUPROPION HCL XL 300 MG TAB: 300 | 90 days supply | Qty: 90 | Fill #0

## 2019-03-08 NOTE — Progress Notes (Signed)
Virtual Visit via Telephone Note  I connected with Donna Phillips on 03/08/19 at  9:00 AM EST by telephone and verified that I am speaking with the correct person using two identifiers.   I discussed the limitations, risks, security and privacy concerns of performing an evaluation and management service by telephone and the availability of in person appointments. I also discussed with the patient that there may be a patient responsible charge related to this service. The patient expressed understanding and agreed to proceed.   History of Present Illness: Patient was evaluated by phone session.  She is taking Wellbutrin XL 300 mg daily which is helping her anxiety and depression.  She is sleeping good.  She denies any nervousness, feeling of hopelessness or any crying spells.  Her sleep is good.  Her 46-year-old daughter started school in person last month and she is very pleased with that.  Her husband is doing Oceanographer and hoping to finish her masters earlier next year.  Patient reported her weight is stable.  Her energy level is good.  She denies any tremors, shakes or any EPS.  She like to continue Wellbutrin which is helping her depression and anxiety.  She is also in therapy with Charolotte Eke is also going well.  Past Psychiatric History:Reviewed. H/Odepression and anxiety.Took Prozac from 2005-2013 untilgot pregnant. Tried briefly Lexapro but causes side effects.Noh/oinpatient treatment,suicidalattempt or mania.   Psychiatric Specialty Exam: Physical Exam  Review of Systems  There were no vitals taken for this visit.There is no height or weight on file to calculate BMI.  General Appearance: NA  Eye Contact:  NA  Speech:  Clear and Coherent and Normal Rate  Volume:  Normal  Mood:  Euthymic  Affect:  NA  Thought Process:  Goal Directed  Orientation:  Full (Time, Place, and Person)  Thought Content:  WDL and Logical  Suicidal Thoughts:  No  Homicidal Thoughts:  No  Memory:   Immediate;   Good Recent;   Good Remote;   Good  Judgement:  Good  Insight:  Good  Psychomotor Activity:  NA  Concentration:  Concentration: Good and Attention Span: Good  Recall:  Good  Fund of Knowledge:  Good  Language:  Good  Akathisia:  No  Handed:  Right  AIMS (if indicated):     Assets:  Communication Skills Desire for Improvement Housing Resilience Social Support Transportation  ADL's:  Intact  Cognition:  WNL  Sleep:   ok      Assessment and Plan: Major depressive disorder, recurrent.  Generalized anxiety disorder.  Patient is a stable on her current dose of Wellbutrin.  She has no side effects.  Continue Wellbutrin XL 300 mg daily.  Encouraged to continue therapy with Charolotte Eke.  Recommended to call us back if she has any question or any concern.  Follow-up in 3 months.  Follow Up Instructions:    I discussed the assessment and treatment plan with the patient. The patient was provided an opportunity to ask questions and all were answered. The patient agreed with the plan and demonstrated an understanding of the instructions.   The patient was advised to call back or seek an in-person evaluation if the symptoms worsen or if the condition fails to improve as anticipated.  I provided 15 minutes of non-face-to-face time during this encounter.   Donna Nations, MD

## 2019-03-13 ENCOUNTER — Encounter (HOSPITAL_COMMUNITY): Payer: Self-pay | Admitting: Licensed Clinical Social Worker

## 2019-03-13 ENCOUNTER — Other Ambulatory Visit: Payer: Self-pay

## 2019-03-13 ENCOUNTER — Ambulatory Visit (INDEPENDENT_AMBULATORY_CARE_PROVIDER_SITE_OTHER): Payer: No Typology Code available for payment source | Admitting: Licensed Clinical Social Worker

## 2019-03-13 DIAGNOSIS — F330 Major depressive disorder, recurrent, mild: Secondary | ICD-10-CM | POA: Diagnosis not present

## 2019-03-13 NOTE — Progress Notes (Signed)
Virtual Visit via Video Note  I connected with Donna Phillips on 03/13/19 at 10:00am by a video enabled telemedicine application and verified that I am speaking with the correct person using two identifiers.   I discussed the limitations of evaluation and management by telemedicine and the availability of in person appointments. The patient expressed understanding and agreed to proceed.  History of Present Illness: Pt was referred to therapy by her psychiatrist for depressive symptoms.    Observations/Objective: Reduce irritability. Pt presented WNL today for her webex therapy session. Pt discussed her psychiatric symptoms and current life events .Asked open ended questions. Patient reports her moods are stable, her child is back in school. Patient discussed her thanksgiving plans with family over zoom. Again, patient discussed her feelings of her daughter being in school with Covid and now after christmas break, 3rd-5th graders will be back in the school. Patient reports she is able to focus more on her own self care now that her daughter is back in school.  Patient still wants to continue to have therapy 1x per month.  Reviewed tx plan with patient with patient verbalizing acceptance of the plan.  PLAN:Life Purpse    Assessment and Plan: Continue to see pt via webex.   Follow Up Instructions:    I discussed the assessment and treatment plan with the patient. The patient was provided an opportunity to ask questions and all were answered. The patient agreed with the plan and demonstrated an understanding of the instructions.   The patient was advised to call back or seek an in-person evaluation if the symptoms worsen or if the condition fails to improve as anticipated.  I provided 40 minutes of non-face-to-face time during this encounter.   Micheal Murad S, LCAS

## 2019-03-14 MED FILL — LARIN FE 1.5-30 TABLET: 1.5-30 | 84 days supply | Qty: 84 | Fill #2

## 2019-04-10 ENCOUNTER — Ambulatory Visit (INDEPENDENT_AMBULATORY_CARE_PROVIDER_SITE_OTHER): Payer: No Typology Code available for payment source | Admitting: Licensed Clinical Social Worker

## 2019-04-10 ENCOUNTER — Other Ambulatory Visit: Payer: Self-pay

## 2019-04-10 ENCOUNTER — Encounter (HOSPITAL_COMMUNITY): Payer: Self-pay | Admitting: Licensed Clinical Social Worker

## 2019-04-10 DIAGNOSIS — F330 Major depressive disorder, recurrent, mild: Secondary | ICD-10-CM | POA: Diagnosis not present

## 2019-04-10 NOTE — Progress Notes (Signed)
Virtual Visit via Video Note  I connected with Donna Phillips on 04/10/19 at 10:00am by a video enabled telemedicine application and verified that I am speaking with the correct person using two identifiers.   I discussed the limitations of evaluation and management by telemedicine and the availability of in person appointments. The patient expressed understanding and agreed to proceed.  History of Present Illness: Pt was referred to therapy by her psychiatrist for depressive symptoms.    Observations/Objective: Reduce irritability. Pt presented WNL today for her webex therapy session. Pt discussed her psychiatric symptoms and current life events .Asked open ended questions. Patient reports her moods are stable, her child continues in school. Patient discussed her feelings about her daughter being in school (masks, social distancing, handwashing). Her daughter is adjusting to being in school and patient thinks it is best for her daughter to be in school, which keeps patient's moods stable.Patient discussed family relationships. Used socratic questions. Patient still wants to be seen 1x per month.  Assessment and Plan: Counselor will continue to meet with patient to address treatment plan goals. Patient will continue to follow recommendations of providers and implement skills learned in session. Continue to see pt via webex.   Follow Up Instructions:I discussed the assessment and treatment plan with the patient. The patient was provided an opportunity to ask questions and all were answered. The patient agreed with the plan and demonstrated an understanding of the instructions.   The patient was advised to call back or seek an in-person evaluation if the symptoms worsen or if the condition fails to improve as anticipated.  I provided 25 minutes of non-face-to-face time during this encounter.   Lavanya Roa S, LCAS

## 2019-04-24 ENCOUNTER — Other Ambulatory Visit (HOSPITAL_COMMUNITY): Payer: Self-pay | Admitting: Internal Medicine

## 2019-04-24 MED FILL — VERAPAMIL SR 120 MG CAPSULE: 120 | 90 days supply | Qty: 90 | Fill #0

## 2019-04-24 MED FILL — RIZATRIPTAN BENZOATE 10 MG: 10 | 30 days supply | Qty: 9 | Fill #0

## 2019-05-15 ENCOUNTER — Ambulatory Visit (INDEPENDENT_AMBULATORY_CARE_PROVIDER_SITE_OTHER): Payer: No Typology Code available for payment source | Admitting: Licensed Clinical Social Worker

## 2019-05-15 ENCOUNTER — Encounter (HOSPITAL_COMMUNITY): Payer: Self-pay | Admitting: Licensed Clinical Social Worker

## 2019-05-15 ENCOUNTER — Other Ambulatory Visit: Payer: Self-pay

## 2019-05-15 DIAGNOSIS — F330 Major depressive disorder, recurrent, mild: Secondary | ICD-10-CM | POA: Diagnosis not present

## 2019-05-15 NOTE — Progress Notes (Signed)
Virtual Visit via Video Note  I connected with Donna Phillips on 05/15/19 at 10:00am by a video enabled telemedicine application and verified that I am speaking with the correct person using two identifiers.   I discussed the limitations of evaluation and management by telemedicine and the availability of in person appointments. The patient expressed understanding and agreed to proceed.  History of Present Illness: Pt was referred to therapy by her psychiatrist for depressive symptoms.    Observations/Objective: Reduce irritability. Pt presented WNL today for her webex therapy session. Pt discussed her psychiatric symptoms and current life events. Asked open ended questions. Patient discussed the relationship with her parents that she is struggling with. Used CBT and assessed family dynamics and unresolved/generational/historic issues. Again, patient discussed her feelings about her daughter being in school (masks, social distancing, handwashing). Her daughter is adjusting to being in school and patient thinks it is best for her daughter to be in school, which keeps patient's moods stable. Patient still wants to be seen 1x per month.  Assessment and Plan: Counselor will continue to meet with patient to address treatment plan goals. Patient will continue to follow recommendations of providers and implement skills learned in session. Continue to see pt via webex.   Follow Up Instructions:I discussed the assessment and treatment plan with the patient. The patient was provided an opportunity to ask questions and all were answered. The patient agreed with the plan and demonstrated an understanding of the instructions.   The patient was advised to call back or seek an in-person evaluation if the symptoms worsen or if the condition fails to improve as anticipated.  I provided 45 minutes of non-face-to-face time during this encounter.   Adriell Polansky S, LCAS

## 2019-06-06 ENCOUNTER — Ambulatory Visit (INDEPENDENT_AMBULATORY_CARE_PROVIDER_SITE_OTHER): Payer: No Typology Code available for payment source | Admitting: Psychiatry

## 2019-06-06 ENCOUNTER — Encounter (HOSPITAL_COMMUNITY): Payer: Self-pay | Admitting: Psychiatry

## 2019-06-06 ENCOUNTER — Other Ambulatory Visit: Payer: Self-pay

## 2019-06-06 DIAGNOSIS — F330 Major depressive disorder, recurrent, mild: Secondary | ICD-10-CM | POA: Diagnosis not present

## 2019-06-06 DIAGNOSIS — F419 Anxiety disorder, unspecified: Secondary | ICD-10-CM

## 2019-06-06 MED ORDER — BUPROPION HCL ER (XL) 300 MG PO TB24: 300.0000 mg | ORAL_TABLET | Freq: Every day | ORAL | 0 refills | Status: DC

## 2019-06-06 MED FILL — BUPROPION HCL ER (XL) 300 M: 300 | 90 days supply | Qty: 90 | Fill #0

## 2019-06-06 NOTE — Progress Notes (Signed)
Virtual Visit via Telephone Note  I connected with Donna Phillips on 06/06/19 at  9:00 AM EST by telephone and verified that I am speaking with the correct person using two identifiers.   I discussed the limitations, risks, security and privacy concerns of performing an evaluation and management service by telephone and the availability of in person appointments. I also discussed with the patient that there may be a patient responsible charge related to this service. The patient expressed understanding and agreed to proceed.   History of Present Illness: Patient was evaluated by phone session.  She is taking Wellbutrin XL 300 mg daily which is helping her anxiety and depression.  She is sleeping good.  She is hoping her husband finished her master the end of April or early May.  She is also happy that daughter started school and she is doing very well.  She does feel the current dose of Wellbutrin working very well.  She does elliptical 1 hour every day.  Her weight is stable.  She is in therapy with Charolotte Eke.  She denies any crying spells or any feeling of hopelessness or worthlessness.  She has no tremors, shakes or any EPS.  Recently her PCP started her on verapamil as patient was having headaches and found that she has hypertension.  Since started verapamil her blood pressure is much better and she has no more headaches.  She denies drinking or using any illegal substances.   Past Psychiatric History:Reviewed. H/Odepression and anxiety.Took Prozac from 2005-2013 untilgot pregnant. Tried briefly Lexapro but causes side effects.Noh/oinpatient treatment,suicidalattempt or mania.   Psychiatric Specialty Exam: Physical Exam  Review of Systems  There were no vitals taken for this visit.There is no height or weight on file to calculate BMI.  General Appearance: NA  Eye Contact:  NA  Speech:  Clear and Coherent and Normal Rate  Volume:  Normal  Mood:  Euthymic  Affect:  NA   Thought Process:  Coherent and Goal Directed  Orientation:  Full (Time, Place, and Person)  Thought Content:  WDL and Logical  Suicidal Thoughts:  No  Homicidal Thoughts:  No  Memory:  Immediate;   Good Recent;   Good Remote;   Good  Judgement:  Good  Insight:  Present  Psychomotor Activity:  NA  Concentration:  Concentration: Good and Attention Span: Good  Recall:  Good  Fund of Knowledge:  Good  Language:  Good  Akathisia:  No  Handed:  Right  AIMS (if indicated):     Assets:  Communication Skills Desire for Improvement Housing Resilience Social Support  ADL's:  Intact  Cognition:  WNL  Sleep:   good      Assessment and Plan: Major depressive disorder, recurrent.  Anxiety.  Patient doing very well on her current medicine.  She has no tremors shakes or any EPS.  I will continue Wellbutrin XL 300 mg daily.  Encouraged to continue therapy with Charolotte Eke.  She started taking blood pressure medicine that is helping her headaches.  Recommended to call us back if she has any question or any concern.  Follow-up in 3 months.  Follow Up Instructions:    I discussed the assessment and treatment plan with the patient. The patient was provided an opportunity to ask questions and all were answered. The patient agreed with the plan and demonstrated an understanding of the instructions.   The patient was advised to call back or seek an in-person evaluation if the symptoms worsen or  if the condition fails to improve as anticipated.  I provided 20 minutes of non-face-to-face time during this encounter.   [name], MD

## 2019-06-11 MED FILL — LARIN FE 1.5-30 TABLET: 1.5-30 | 84 days supply | Qty: 84 | Fill #3

## 2019-06-12 ENCOUNTER — Ambulatory Visit (HOSPITAL_COMMUNITY): Payer: No Typology Code available for payment source | Admitting: Licensed Clinical Social Worker

## 2019-06-18 ENCOUNTER — Other Ambulatory Visit (HOSPITAL_COMMUNITY): Payer: Self-pay | Admitting: Obstetrics and Gynecology

## 2019-07-03 ENCOUNTER — Ambulatory Visit (INDEPENDENT_AMBULATORY_CARE_PROVIDER_SITE_OTHER): Payer: No Typology Code available for payment source | Admitting: Licensed Clinical Social Worker

## 2019-07-03 ENCOUNTER — Encounter (HOSPITAL_COMMUNITY): Payer: Self-pay | Admitting: Licensed Clinical Social Worker

## 2019-07-03 ENCOUNTER — Other Ambulatory Visit: Payer: Self-pay

## 2019-07-03 DIAGNOSIS — F411 Generalized anxiety disorder: Secondary | ICD-10-CM

## 2019-07-03 DIAGNOSIS — F330 Major depressive disorder, recurrent, mild: Secondary | ICD-10-CM | POA: Diagnosis not present

## 2019-07-03 NOTE — Progress Notes (Signed)
Virtual Visit via Video Note  I connected with Donna Phillips on 05/15/19 at 10:00am by a video enabled telemedicine application and verified that I am speaking with the correct person using two identifiers.   I discussed the limitations of evaluation and management by telemedicine and the availability of in person appointments. The patient expressed understanding and agreed to proceed.  History of Present Illness: Pt was referred to therapy by her psychiatrist for depressive symptoms.    Observations/Objective: Reduce irritability. Pt presented WNL today for her webex therapy session. Pt discussed her psychiatric symptoms and current life events. Patient discussed her current symptoms and coping skills. Reviewed tx plan where patient verbalized acceptance of plan. Patient provided an update of her symptoms, psychiatrist visit, continued medication regimen, family life, mother/daughter relationship. Cln provided coping skills. Clinician utilized MI OARS to reflect and summarize thoughts and feelings. Patient still wants to be seen 1x per month.  Assessment and Plan: Counselor will continue to meet with patient to address treatment plan goals. Patient will continue to follow recommendations of providers and implement skills learned in session. Continue to see pt via webex.   Follow Up Instructions:I discussed the assessment and treatment plan with the patient. The patient was provided an opportunity to ask questions and all were answered. The patient agreed with the plan and demonstrated an understanding of the instructions.   The patient was advised to call back or seek an in-person evaluation if the symptoms worsen or if the condition fails to improve as anticipated.  I provided 45 minutes of non-face-to-face time during this encounter.   Rasmus Preusser S, LCAS

## 2019-07-23 ENCOUNTER — Telehealth (HOSPITAL_COMMUNITY): Payer: Self-pay | Admitting: *Deleted

## 2019-07-23 NOTE — Telephone Encounter (Signed)
Yes. She can have letter excusing from jury duty due to underline psychiatric illness.

## 2019-07-23 NOTE — Telephone Encounter (Signed)
Pt requesting a letter excusing her from jury duty. Please review and advise.

## 2019-07-24 ENCOUNTER — Encounter (HOSPITAL_COMMUNITY): Payer: Self-pay

## 2019-07-30 MED FILL — VERAPAMIL SR 120 MG CAPSULE: 120 | 90 days supply | Qty: 90 | Fill #1

## 2019-08-08 ENCOUNTER — Encounter (HOSPITAL_COMMUNITY): Payer: Self-pay | Admitting: Licensed Clinical Social Worker

## 2019-08-08 ENCOUNTER — Other Ambulatory Visit: Payer: Self-pay

## 2019-08-08 ENCOUNTER — Ambulatory Visit (INDEPENDENT_AMBULATORY_CARE_PROVIDER_SITE_OTHER): Payer: No Typology Code available for payment source | Admitting: Licensed Clinical Social Worker

## 2019-08-08 DIAGNOSIS — F419 Anxiety disorder, unspecified: Secondary | ICD-10-CM | POA: Diagnosis not present

## 2019-08-08 DIAGNOSIS — F330 Major depressive disorder, recurrent, mild: Secondary | ICD-10-CM | POA: Diagnosis not present

## 2019-08-08 NOTE — Progress Notes (Signed)
Virtual Visit via Video Note  I connected with Donna Phillips on 08/08/19 at 10:00am by a video enabled telemedicine application and verified that I am speaking with the correct person using two identifiers.   I discussed the limitations of evaluation and management by telemedicine and the availability of in person appointments. The patient expressed understanding and agreed to proceed.  History of Present Illness: Pt was referred to therapy by her psychiatrist for depressive symptoms.    Observations/Objective: Reduce irritability. Pt presented WNL today for her virtual therapy session. Pt discussed her psychiatric symptoms and current life events. Patient discussed her current symptoms and coping skills. Patient provided an update of her symptoms,   medication, family life, mother/daughter relationship. Cln and patient discussed discharging patient due to stability of symptoms. Patient was in agreement of discharge.  Assessment and Plan:  Discharge from services.     Follow Up Instructions:I discussed the assessment and treatment plan with the patient. The patient was provided an opportunity to ask questions and all were answered. The patient agreed with the plan and demonstrated an understanding of the instructions.   The patient was advised to call back or seek an in-person evaluation if the symptoms worsen or if the condition fails to improve as anticipated.  I provided 30 minutes of non-face-to-face time during this encounter.   Heavan Francom S, LCAS

## 2019-08-30 MED FILL — LARIN FE 1.5-30 TABLET: 1.5-30 | 84 days supply | Qty: 84 | Fill #0

## 2019-09-06 ENCOUNTER — Other Ambulatory Visit: Payer: Self-pay

## 2019-09-06 ENCOUNTER — Telehealth (INDEPENDENT_AMBULATORY_CARE_PROVIDER_SITE_OTHER): Payer: No Typology Code available for payment source | Admitting: Psychiatry

## 2019-09-06 ENCOUNTER — Encounter (HOSPITAL_COMMUNITY): Payer: Self-pay | Admitting: Psychiatry

## 2019-09-06 DIAGNOSIS — F330 Major depressive disorder, recurrent, mild: Secondary | ICD-10-CM | POA: Diagnosis not present

## 2019-09-06 DIAGNOSIS — F419 Anxiety disorder, unspecified: Secondary | ICD-10-CM | POA: Diagnosis not present

## 2019-09-06 MED ORDER — BUPROPION HCL ER (XL) 300 MG PO TB24: 300.0000 mg | ORAL_TABLET | Freq: Every day | ORAL | 0 refills | Status: DC

## 2019-09-06 NOTE — Progress Notes (Signed)
Virtual Visit via Telephone Note  I connected with Donna Phillips on 09/06/19 at  8:40 AM EDT by telephone and verified that I am speaking with the correct person using two identifiers.   I discussed the limitations, risks, security and privacy concerns of performing an evaluation and management service by telephone and the availability of in person appointments. I also discussed with the patient that there may be a patient responsible charge related to this service. The patient expressed understanding and agreed to proceed.   Patient Location: Home Provider Location: Home Office  History of Present Illness: Patient is evaluated by phone session.  She is compliant with Wellbutrin and reported her anxiety and depression is under control.  She is sleeping better.  Now she moved her therapy session went back as needed.  Her husband recently graduated from a [employer] and she is very happy about it.  She has no tremors, shakes or any EPS.  She denies any crying spells or any feeling of hopelessness or worthlessness.  Her energy level is good.  Her appetite is okay.  She is taking verapamil which is helping her headaches and blood pressure.  She denies drinking or using any illegal substances.  She like to keep her current medication.   Past Psychiatric History:Reviewed. H/Odepression and anxiety.Took Prozac from 2005-2013 untilgot pregnant. Tried briefly Lexapro but causes side effects.Noh/oinpatient treatment,suicidalattempt or mania.   Psychiatric Specialty Exam: Physical Exam  Review of Systems  There were no vitals taken for this visit.There is no height or weight on file to calculate BMI.  General Appearance: NA  Eye Contact:  NA  Speech:  Clear and Coherent  Volume:  Normal  Mood:  Euthymic  Affect:  NA  Thought Process:  Goal Directed  Orientation:  Full (Time, Place, and Person)  Thought Content:  Logical  Suicidal Thoughts:  No  Homicidal Thoughts:  No  Memory:   Immediate;   Good Recent;   Good Remote;   Good  Judgement:  Good  Insight:  Good  Psychomotor Activity:  NA  Concentration:  Concentration: Good and Attention Span: Good  Recall:  Good  Fund of Knowledge:  Good  Language:  Good  Akathisia:  No  Handed:  Right  AIMS (if indicated):     Assets:  Communication Skills Desire for Improvement Housing Resilience Social Support Talents/Skills Transportation  ADL's:  Intact  Cognition:  WNL  Sleep:   good      Assessment and Plan: Major depressive disorder, recurrent.  Anxiety.  Patient is stable on Wellbutrin XL 300 mg daily.  She has no side effects.  I will continue current dose.  She is in therapy with Charolotte Eke.  Discussed medication side effects and benefits.  Recommended to call us back if she has any question or any concerns.  Follow-up in 3 months.  Follow Up Instructions:    I discussed the assessment and treatment plan with the patient. The patient was provided an opportunity to ask questions and all were answered. The patient agreed with the plan and demonstrated an understanding of the instructions.   The patient was advised to call back or seek an in-person evaluation if the symptoms worsen or if the condition fails to improve as anticipated.  I provided 15 minutes of non-face-to-face time during this encounter.   [name], MD

## 2019-10-09 IMAGING — MG DIGITAL SCREENING BILATERAL MAMMOGRAM WITH TOMO AND CAD
8 series · 9 of 24 positions shown · non-contrast
Comparison: Previous exam(s).

CLINICAL DATA: Screening.

EXAM:
DIGITAL SCREENING BILATERAL MAMMOGRAM WITH TOMO AND CAD

[L CC synth-2D]
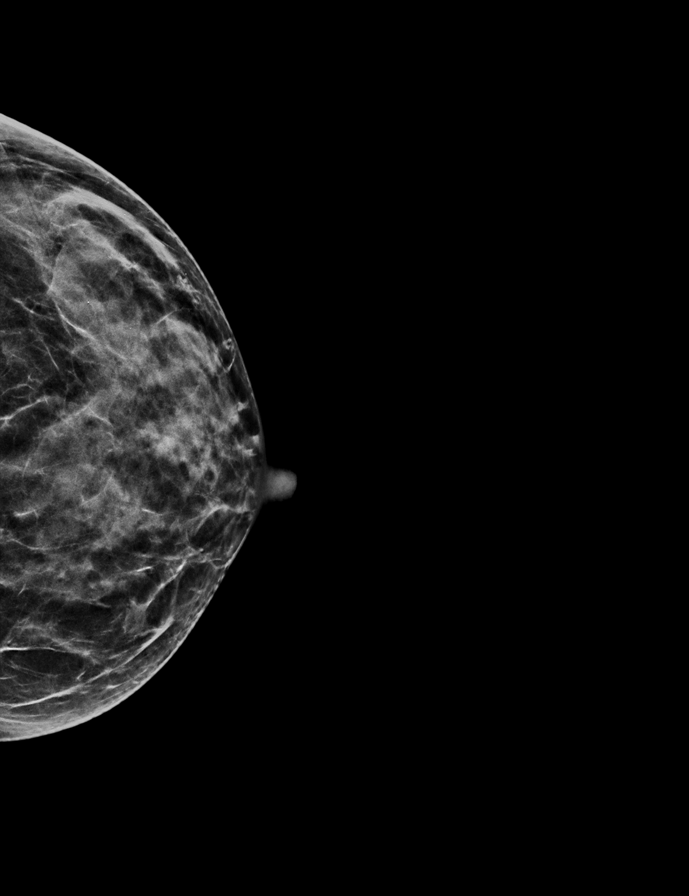

[R MLO synth-2D]
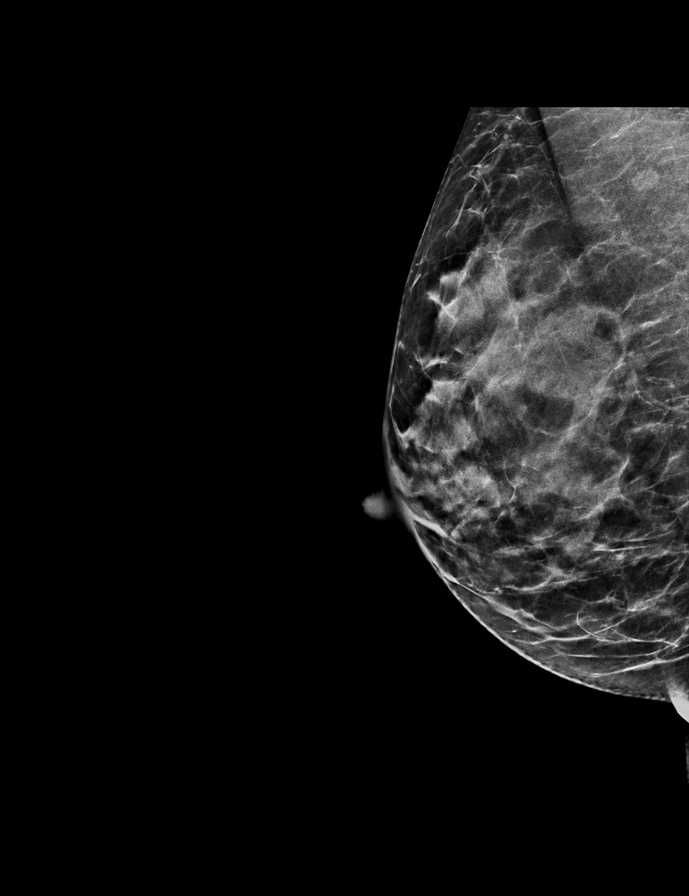

[L MLO synth-2D]
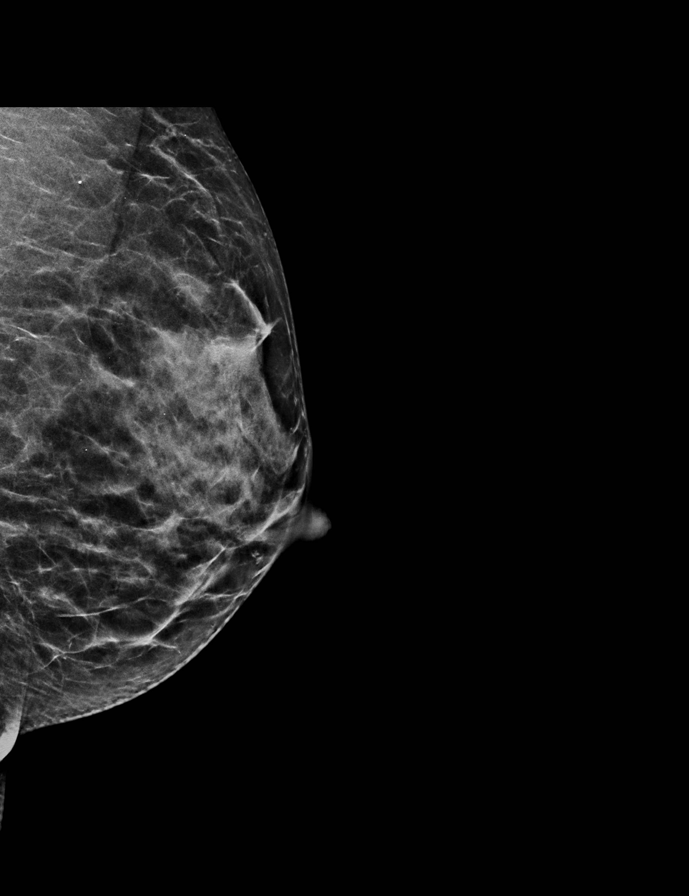

[R CC synth-2D]
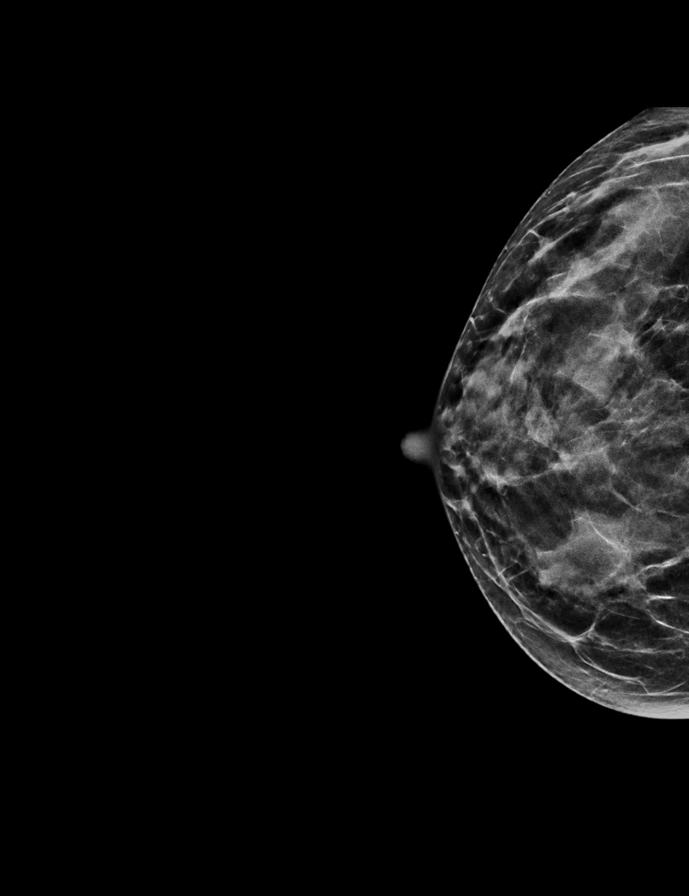

[L CC tomo · 2 of 46 frames shown]
[frame 15/46]
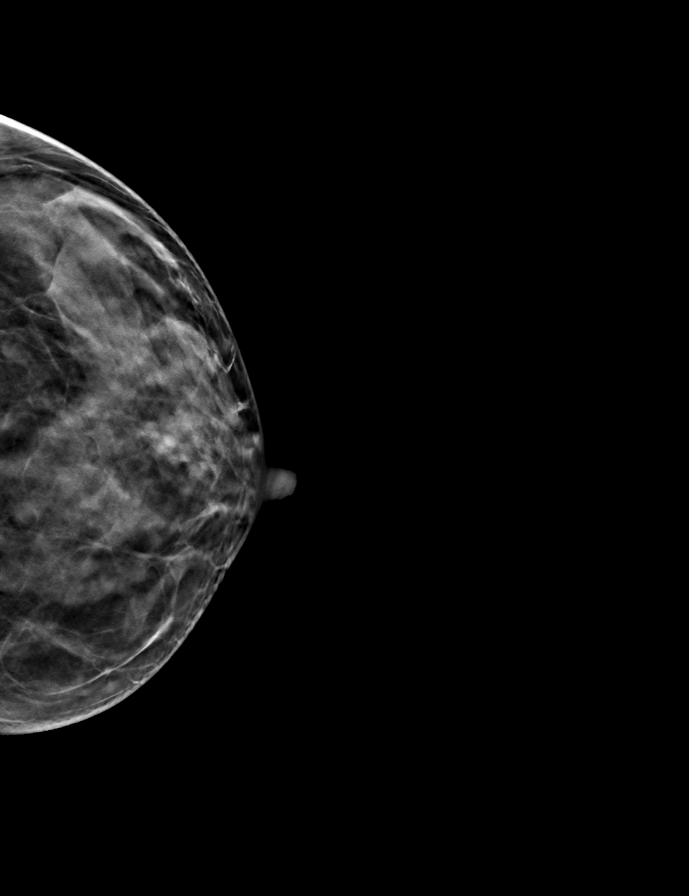
[frame 23/46]
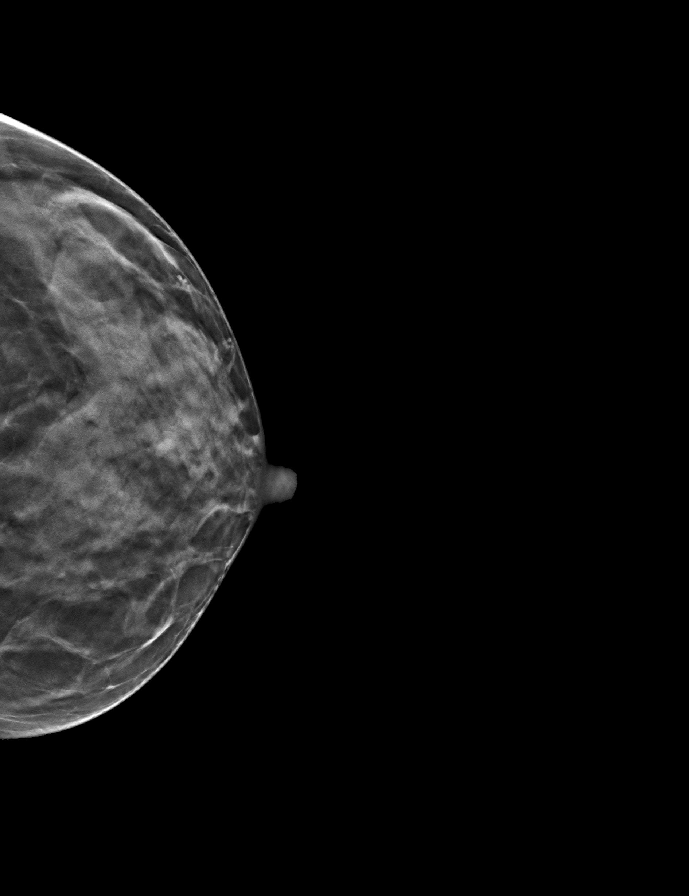

[R CC tomo · tomo slice 23/46.0]
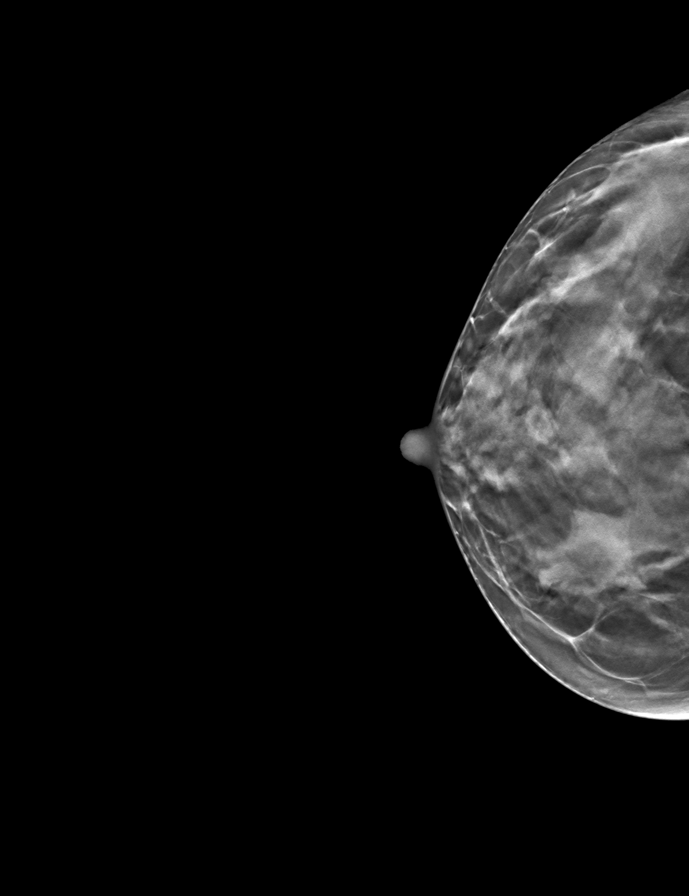

[R MLO tomo · tomo slice 25/49.0]
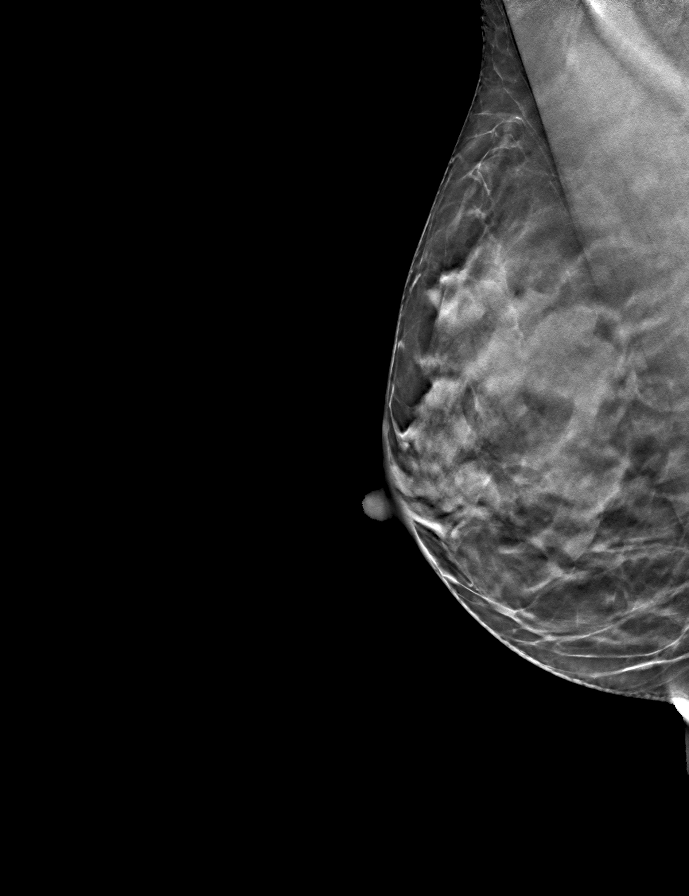

[L MLO tomo · tomo slice 24/47.0]
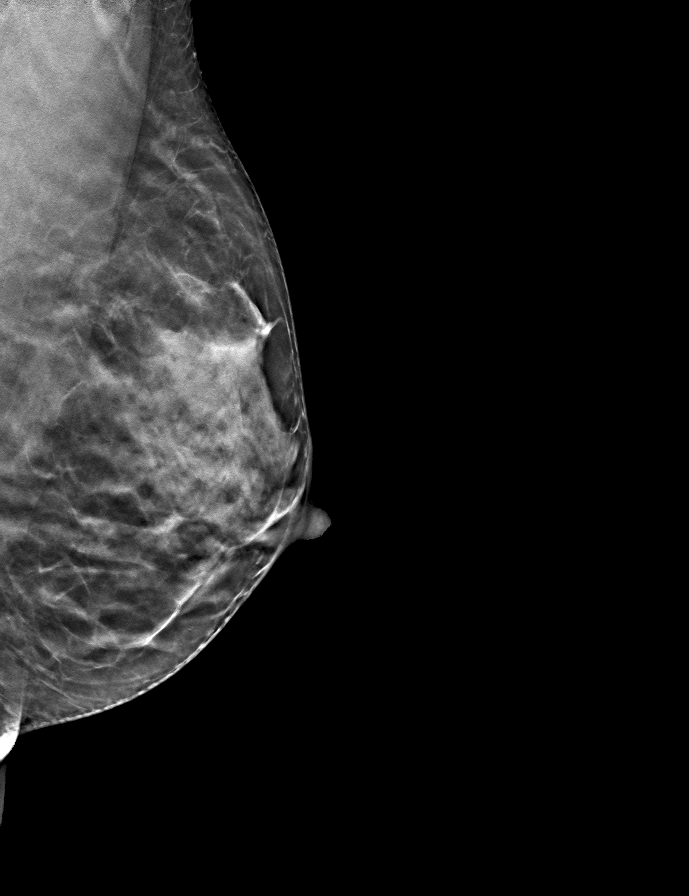

[9 of 24 positions shown; findings below may reference images not displayed]

ACR Breast Density Category c: The breast tissue is heterogeneously
dense, which may obscure small masses.
FINDINGS: There are no findings suspicious for malignancy. Images were
processed with CAD.
IMPRESSION: No mammographic evidence of malignancy. A result letter of this
screening mammogram will be mailed directly to the patient.

RECOMMENDATION:
Screening mammogram in one year. (Code:FT-U-LHB)

BI-RADS CATEGORY  1: Negative.

## 2019-10-29 MED FILL — VERAPAMIL SR 120 MG CAPSULE: 120 | 90 days supply | Qty: 90 | Fill #2

## 2019-11-26 MED FILL — LARIN FE 1.5-30 TABLET: 1.5-30 | 84 days supply | Qty: 84 | Fill #1

## 2019-12-06 ENCOUNTER — Encounter (HOSPITAL_COMMUNITY): Payer: Self-pay | Admitting: Psychiatry

## 2019-12-06 ENCOUNTER — Other Ambulatory Visit: Payer: Self-pay

## 2019-12-06 ENCOUNTER — Telehealth (INDEPENDENT_AMBULATORY_CARE_PROVIDER_SITE_OTHER): Payer: No Typology Code available for payment source | Admitting: Psychiatry

## 2019-12-06 DIAGNOSIS — F419 Anxiety disorder, unspecified: Secondary | ICD-10-CM | POA: Diagnosis not present

## 2019-12-06 DIAGNOSIS — F330 Major depressive disorder, recurrent, mild: Secondary | ICD-10-CM | POA: Diagnosis not present

## 2019-12-06 MED ORDER — BUPROPION HCL ER (XL) 150 MG PO TB24: 150.0000 mg | ORAL_TABLET | Freq: Every day | ORAL | 0 refills | Status: DC

## 2019-12-06 MED FILL — buPROPion HCL ER (XL) 150 M: 150 | 90 days supply | Qty: 90 | Fill #0

## 2019-12-06 NOTE — Progress Notes (Signed)
Virtual Visit via Telephone Note  I connected with Donna Phillips on 12/06/19 at  8:40 AM EDT by telephone and verified that I am speaking with the correct person using two identifiers.  Location: Patient: home Provider: home office   I discussed the limitations, risks, security and privacy concerns of performing an evaluation and management service by telephone and the availability of in person appointments. I also discussed with the patient that there may be a patient responsible charge related to this service. The patient expressed understanding and agreed to proceed.   History of Present Illness: Patient is evaluated by phone session.  She had a good summer.  She went to [place] to visit her father-in-law with the family and had a good time.  Her 14-year-old child is now started second grade and she is happy that she is adjusting to the school and the teacher.  She denies any crying spells or any feeling of hopelessness.  Her energy level is good.  She is sleeping good.  However she noticed sometimes she has tremors and headaches.  She also checked her blood pressure a few times which was high.  He denies any shortness of breath or any sweating.  Her weight is unchanged from the past.  She denies any paranoia, hallucination, suicidal thoughts.   Past Psychiatric History:Reviewed. H/Odepression and anxiety.Took Prozac from 2005-2013 untilgot pregnant. Tried briefly Lexapro but causes side effects.Noh/oinpatient treatment,suicidalattempt or mania.   Psychiatric Specialty Exam: Physical Exam  Review of Systems  Weight 113 lb (51.3 kg).There is no height or weight on file to calculate BMI.  General Appearance: NA  Eye Contact:  NA  Speech:  Normal Rate  Volume:  Normal  Mood:  Euthymic  Affect:  NA  Thought Process:  Goal Directed  Orientation:  Full (Time, Place, and Person)  Thought Content:  Logical  Suicidal Thoughts:  No  Homicidal Thoughts:  No  Memory:  Immediate;    Good Recent;   Good Remote;   Good  Judgement:  Good  Insight:  Good  Psychomotor Activity:  shares  Concentration:  Concentration: Good and Attention Span: Good  Recall:  Good  Fund of Knowledge:  Good  Language:  Good  Akathisia:  No  Handed:  Right  AIMS (if indicated):     Assets:  Communication Skills Desire for Improvement Resilience  ADL's:  Intact  Cognition:  WNL  Sleep:         Assessment and Plan: Major depressive disorder, recurrent.  Anxiety.  Discussed medication side effects possible due to Wellbutrin.  Recommend to reduce the dose to 150 and closely monitor if the shakes and headaches go away.  If shakes and headaches and blood pressure does not get better then recommend to consult her physician.  Patient also taking birth control that could be a reason of headaches and blood pressure.  Patient is thinking to come off from the birth control anyway as she is at the age when she think unlikely to get pregnant.  I recommend if reducing the dose of Wellbutrin cause worsening of anxiety and depression then please call us immediately.  She agreed with the plan.  Follow-up in 3 months.  We would reduce the Wellbutrin 150 mg only.  Follow Up Instructions:    I discussed the assessment and treatment plan with the patient. The patient was provided an opportunity to ask questions and all were answered. The patient agreed with the plan and demonstrated an understanding of the instructions.  The patient was advised to call back or seek an in-person evaluation if the symptoms worsen or if the condition fails to improve as anticipated.  I provided 15 minutes of non-face-to-face time during this encounter.   [name], MD

## 2019-12-25 ENCOUNTER — Other Ambulatory Visit (HOSPITAL_COMMUNITY): Payer: Self-pay | Admitting: Internal Medicine

## 2019-12-25 DIAGNOSIS — Z1231 Encounter for screening mammogram for malignant neoplasm of breast: Secondary | ICD-10-CM

## 2020-01-22 MED FILL — VERAPAMIL SR 120 MG CAPSULE: 120 | 90 days supply | Qty: 90 | Fill #3

## 2020-01-23 ENCOUNTER — Ambulatory Visit (HOSPITAL_COMMUNITY)
Admission: RE | Admit: 2020-01-23 | Discharge: 2020-01-23 | Disposition: A | Discharge status: Home / Self Care | Payer: No Typology Code available for payment source | Source: Ambulatory Visit | Attending: Internal Medicine | Admitting: Internal Medicine

## 2020-01-23 ENCOUNTER — Other Ambulatory Visit: Payer: Self-pay

## 2020-01-23 DIAGNOSIS — Z1231 Encounter for screening mammogram for malignant neoplasm of breast: Secondary | ICD-10-CM | POA: Diagnosis present

## 2020-03-06 ENCOUNTER — Other Ambulatory Visit (HOSPITAL_COMMUNITY): Payer: Self-pay | Admitting: Psychiatry

## 2020-03-06 ENCOUNTER — Encounter (HOSPITAL_COMMUNITY): Payer: Self-pay | Admitting: Psychiatry

## 2020-03-06 ENCOUNTER — Telehealth (INDEPENDENT_AMBULATORY_CARE_PROVIDER_SITE_OTHER): Payer: No Typology Code available for payment source | Admitting: Psychiatry

## 2020-03-06 ENCOUNTER — Other Ambulatory Visit: Payer: Self-pay

## 2020-03-06 DIAGNOSIS — F330 Major depressive disorder, recurrent, mild: Secondary | ICD-10-CM

## 2020-03-06 DIAGNOSIS — F419 Anxiety disorder, unspecified: Secondary | ICD-10-CM | POA: Diagnosis not present

## 2020-03-06 MED ORDER — BUPROPION HCL ER (XL) 150 MG PO TB24: 150.0000 mg | ORAL_TABLET | Freq: Every day | ORAL | 0 refills | Status: DC

## 2020-03-06 MED FILL — buPROPion HCL ER (XL) 150 M: 150 | 90 days supply | Qty: 90 | Fill #0

## 2020-03-06 NOTE — Progress Notes (Signed)
Virtual Visit via Telephone Note  I connected with Donna Phillips on 03/06/20 at  8:40 AM EST by telephone and verified that I am speaking with the correct person using two identifiers.  Location: Patient: Home Provider: Home Office   I discussed the limitations, risks, security and privacy concerns of performing an evaluation and management service by telephone and the availability of in person appointments. I also discussed with the patient that there may be a patient responsible charge related to this service. The patient expressed understanding and agreed to proceed.   History of Present Illness: Patient is evaluated by phone session.  She is on the phone by herself.  On the last visit we cut down her Wellbutrin because she was complaining of headaches, and increased blood pressure and tremors.  She is doing better on Wellbutrin XL 150.  Her tremors, headaches are reduced.  Her blood pressure is also much better.  She does not feel reducing Wellbutrin causing worsening of anxiety and depressive symptoms.  Her energy level is good.  She is happy with the current dose.  She denies any crying spells, feeling of hopelessness or worthlessness.  Her appetite and weight is unchanged from the past.  She had a good Thanksgiving.  She wants to keep her current medication.    Past Psychiatric History:Reviewed. H/Odepression and anxiety.Took Prozac from 2005-2013 untilgot pregnant. Tried briefly Lexapro but causes side effects.Noh/oinpatient treatment,suicidalattempt or mania.  Psychiatric Specialty Exam: Physical Exam  Review of Systems  Weight 113 lb (51.3 kg).There is no height or weight on file to calculate BMI.  General Appearance: NA  Eye Contact:  NA  Speech:  NA  Volume:  Normal  Mood:  Euthymic  Affect:  NA  Thought Process:  Goal Directed  Orientation:  Full (Time, Place, and Person)  Thought Content:  WDL  Suicidal Thoughts:  No  Homicidal Thoughts:  No  Memory:   Immediate;   Good Recent;   Good Remote;   Good  Judgement:  Good  Insight:  Good  Psychomotor Activity:  NA  Concentration:  Concentration: Good and Attention Span: Good  Recall:  Good  Fund of Knowledge:  Good  Language:  Good  Akathisia:  No  Handed:  Right  AIMS (if indicated):     Assets:  Communication Skills Desire for Improvement Housing Resilience Social Support Transportation  ADL's:  Intact  Cognition:  WNL  Sleep:   ok      Assessment and Plan: Major depressive disorder, recurrent.  Anxiety.  Patient doing better with Wellbutrin XL 150.  Since the dose reduced her tremors and headaches are reduced.  Her blood pressure is also under control.  She still take verapamil for blood pressure but it is controlled.  She decided to keep the birth control because her symptoms got better.  Discussed medication side effects and benefits.  Recommended to call us back if she has any question or any concern.  Continue Wellbutrin XL 150 mg daily.  Follow-up in 3 months.  Follow Up Instructions:    I discussed the assessment and treatment plan with the patient. The patient was provided an opportunity to ask questions and all were answered. The patient agreed with the plan and demonstrated an understanding of the instructions.   The patient was advised to call back or seek an in-person evaluation if the symptoms worsen or if the condition fails to improve as anticipated.  I provided 12 minutes of non-face-to-face time during this encounter.  [name], MD

## 2020-04-14 MED FILL — LARIN FE 1.5-30 TABLET: 1.5-30 | 84 days supply | Qty: 84 | Fill #2

## 2020-04-21 MED FILL — VERAPAMIL SR 120 MG CAPSULE: 120 | 90 days supply | Qty: 90 | Fill #4

## 2020-06-02 ENCOUNTER — Other Ambulatory Visit (HOSPITAL_COMMUNITY): Payer: Self-pay | Admitting: Psychiatry

## 2020-06-02 ENCOUNTER — Telehealth (INDEPENDENT_AMBULATORY_CARE_PROVIDER_SITE_OTHER): Payer: No Typology Code available for payment source | Admitting: Psychiatry

## 2020-06-02 ENCOUNTER — Encounter (HOSPITAL_COMMUNITY): Payer: Self-pay | Admitting: Psychiatry

## 2020-06-02 ENCOUNTER — Other Ambulatory Visit: Payer: Self-pay

## 2020-06-02 DIAGNOSIS — F419 Anxiety disorder, unspecified: Secondary | ICD-10-CM | POA: Diagnosis not present

## 2020-06-02 DIAGNOSIS — F330 Major depressive disorder, recurrent, mild: Secondary | ICD-10-CM

## 2020-06-02 MED ORDER — BUPROPION HCL ER (XL) 150 MG PO TB24: 150.0000 mg | ORAL_TABLET | Freq: Every day | ORAL | 0 refills | Status: DC

## 2020-06-02 MED FILL — buPROPion HCL ER (XL) 150 M: 150 | 90 days supply | Qty: 90 | Fill #0

## 2020-06-02 NOTE — Progress Notes (Signed)
Virtual Visit via Video Note  I connected with Donna Phillips on 06/02/20 at  8:40 AM EST by a video enabled telemedicine application and verified that I am speaking with the correct person using two identifiers.  Location: Patient: Home Provider: Home Office   I discussed the limitations of evaluation and management by telemedicine and the availability of in person appointments. The patient expressed understanding and agreed to proceed.  History of Present Illness: Patient is evaluated by video session.  She is doing well on Wellbutrin XL 150 mg daily.  She is sleeping good.  She denies any crying spells, feeling of hopelessness or worthlessness.  She denies any panic attack, suicidal thoughts.  Her appetite is okay.  Her energy level is good.  Today her 48-year-old daughter Donna Phillips is sick and staying home.  Patient told her energy level is good and she remains active and taking care of 3 dogs.  She has mild tremors on her left hand but it does not interfere in her daily activities.  She reported her blood pressure and headaches are much better.  Recently she had a blood work by her OB/GYN Dr. Willey Phillips and she was told everything is normal.  She like to keep the current medication.   Past Psychiatric History:Reviewed. H/Odepression and anxiety.Took Prozac from 2005-2013 untilgot pregnant. Tried briefly Lexapro but causes side effects.Tried higher dose of Wellbutrin because headaches noh/oinpatient treatment,suicidalattempt or mania.  Psychiatric Specialty Exam: Physical Exam  Review of Systems  Weight 114 lb (51.7 kg).There is no height or weight on file to calculate BMI.  General Appearance: Casual  Eye Contact:  Good  Speech:  Clear and Coherent and Normal Rate  Volume:  Normal  Mood:  pleasent  Affect:  Appropriate  Thought Process:  Goal Directed  Orientation:  Full (Time, Place, and Person)  Thought Content:  WDL and Logical  Suicidal Thoughts:  No  Homicidal Thoughts:  No   Memory:  Immediate;   Good Recent;   Good Remote;   Good  Judgement:  Good  Insight:  Good  Psychomotor Activity:  Normal  Concentration:  Concentration: Good and Attention Span: Good  Recall:  Good  Fund of Knowledge:  Good  Language:  Good  Akathisia:  No  Handed:  Right  AIMS (if indicated):     Assets:  Communication Skills Desire for Improvement Housing Leisure Time Physical Health Resilience Social Support  ADL's:  Intact  Cognition:  WNL  Sleep:   good      Assessment and Plan: Major depressive disorder, recurrent.  Anxiety.  Patient is a stable and doing well on Wellbutrin XL 150 mg daily.  She has mild tremors in her left hand but it does not interfere in her daily activities.  Continue Wellbutrin XL 150 mg daily.  Patient will fax the results to [name].  Recommended to call us back if she has any question or any concern.  Follow-up in 3 months.  Follow Up Instructions:    I discussed the assessment and treatment plan with the patient. The patient was provided an opportunity to ask questions and all were answered. The patient agreed with the plan and demonstrated an understanding of the instructions.   The patient was advised to call back or seek an in-person evaluation if the symptoms worsen or if the condition fails to improve as anticipated.  I provided 19 minutes of non-face-to-face time during this encounter.   [name], MD

## 2020-06-28 ENCOUNTER — Other Ambulatory Visit (HOSPITAL_COMMUNITY): Payer: Self-pay

## 2020-07-01 ENCOUNTER — Other Ambulatory Visit (HOSPITAL_COMMUNITY): Payer: Self-pay | Admitting: Obstetrics and Gynecology

## 2020-07-01 ENCOUNTER — Other Ambulatory Visit (HOSPITAL_COMMUNITY): Payer: Self-pay

## 2020-07-07 ENCOUNTER — Other Ambulatory Visit (HOSPITAL_COMMUNITY): Payer: Self-pay

## 2020-07-08 ENCOUNTER — Other Ambulatory Visit (HOSPITAL_COMMUNITY): Payer: Self-pay

## 2020-07-08 MED ORDER — NORETHIN ACE-ETH ESTRAD-FE 1.5-30 MG-MCG PO TABS
1.0000 | ORAL_TABLET | Freq: Every day | ORAL | 0 refills | Status: DC
  Filled 2020-07-08: qty 28, 28d supply, fill #0

## 2020-07-09 ENCOUNTER — Other Ambulatory Visit (HOSPITAL_COMMUNITY): Payer: Self-pay

## 2020-07-11 ENCOUNTER — Other Ambulatory Visit (HOSPITAL_COMMUNITY): Payer: Self-pay

## 2020-07-21 ENCOUNTER — Other Ambulatory Visit (HOSPITAL_COMMUNITY): Payer: Self-pay | Admitting: Internal Medicine

## 2020-07-22 ENCOUNTER — Other Ambulatory Visit (HOSPITAL_COMMUNITY): Payer: Self-pay

## 2020-07-22 MED ORDER — VERAPAMIL HCL ER 120 MG PO CP24
120.0000 mg | ORAL_CAPSULE | Freq: Every day | ORAL | 4 refills | Status: DC
  Filled 2020-07-22: qty 90, 90d supply, fill #0
  Filled 2020-10-21: qty 90, 90d supply, fill #1
  Filled 2021-01-19: qty 90, 90d supply, fill #2
  Filled 2021-04-20: qty 90, 90d supply, fill #3
  Filled 2021-07-20: qty 90, 90d supply, fill #4

## 2020-07-31 ENCOUNTER — Other Ambulatory Visit (HOSPITAL_COMMUNITY): Payer: Self-pay

## 2020-07-31 MED ORDER — NORETHIN ACE-ETH ESTRAD-FE 1.5-30 MG-MCG PO TABS
1.0000 | ORAL_TABLET | Freq: Every day | ORAL | 3 refills | Status: DC
  Filled 2020-07-31: qty 84, 84d supply, fill #0
  Filled 2020-10-29: qty 84, 84d supply, fill #1
  Filled 2021-01-19: qty 84, 84d supply, fill #2
  Filled 2021-04-14: qty 84, 84d supply, fill #3

## 2020-09-01 ENCOUNTER — Other Ambulatory Visit: Payer: Self-pay

## 2020-09-01 ENCOUNTER — Other Ambulatory Visit (HOSPITAL_COMMUNITY): Payer: Self-pay

## 2020-09-01 ENCOUNTER — Encounter (HOSPITAL_COMMUNITY): Payer: Self-pay | Admitting: Psychiatry

## 2020-09-01 ENCOUNTER — Telehealth (INDEPENDENT_AMBULATORY_CARE_PROVIDER_SITE_OTHER): Payer: No Typology Code available for payment source | Admitting: Psychiatry

## 2020-09-01 VITALS — Wt 117.0 lb

## 2020-09-01 DIAGNOSIS — F330 Major depressive disorder, recurrent, mild: Secondary | ICD-10-CM | POA: Diagnosis not present

## 2020-09-01 DIAGNOSIS — F419 Anxiety disorder, unspecified: Secondary | ICD-10-CM | POA: Diagnosis not present

## 2020-09-01 MED ORDER — SERTRALINE HCL 50 MG PO TABS
50.0000 mg | ORAL_TABLET | Freq: Every day | ORAL | 1 refills | Status: DC
Start: 2020-09-01 — End: 2020-10-27
  Filled 2020-09-01: qty 30, 30d supply, fill #0
  Filled 2020-09-24: qty 30, 30d supply, fill #1

## 2020-09-01 NOTE — Progress Notes (Signed)
Virtual Visit via Video Note  I connected with Donna Phillips on 09/01/20 at  8:40 AM EDT by a video enabled telemedicine application and verified that I am speaking with the correct person using two identifiers.  Location: Patient: Home Provider: Home Office   I discussed the limitations of evaluation and management by telemedicine and the availability of in person appointments. The patient expressed understanding and agreed to proceed.  History of Present Illness: Patient is evaluated by video session.  She is taking Wellbutrin XL 150 mg in the morning and denies any depression, anxiety, crying spells or any feeling of hopelessness or worthlessness.  She is sleeping good and her energy level is good.  However she noticed her tremors are now more and happen every day.  She like to try a different medication.  She has tremors in both hands.  Otherwise things are going well.  She is active and taking care of all 3 dogs.  Her 48 year old daughter is only is also doing well.  Patient excited about upcoming trip to [place] and [place] for 2 weeks.  She denies any panic attack, crying spells.  Past Psychiatric History:Reviewed. H/Odepression and anxiety.Took Prozac from 2005-2013 untilgot pregnant. Tried briefly Lexapro but causes side effects.Tried higher dose of Wellbutrin because headaches noh/oinpatient treatment,suicidalattempt or mania.   Psychiatric Specialty Exam: Physical Exam  Review of Systems  Weight 117 lb (53.1 kg).There is no height or weight on file to calculate BMI.  General Appearance: Casual  Eye Contact:  Good  Speech:  Clear and Coherent  Volume:  Normal  Mood:  Euthymic  Affect:  Appropriate  Thought Process:  Goal Directed  Orientation:  Full (Time, Place, and Person)  Thought Content:  Logical  Suicidal Thoughts:  No  Homicidal Thoughts:  No  Memory:  Immediate;   Good Recent;   Good Remote;   Good  Judgement:  Intact  Insight:  Present   Psychomotor Activity:  Tremor  Concentration:  Concentration: Good and Attention Span: Good  Recall:  Good  Fund of Knowledge:  Good  Language:  Good  Akathisia:  No  Handed:  Right  AIMS (if indicated):     Assets:  Communication Skills Desire for Improvement Housing Resilience Social Support Transportation  ADL's:  Intact  Cognition:  WNL  Sleep:   ok      Assessment and Plan: Major depressive disorder, recurrent.  Anxiety.  Patient like to try a different medication as noticed tremors getting worse and on a daily basis from Wellbutrin.  Patient had tried in the past Prozac and Lexapro with poor outcome.  Discussed choices like Trintellix and Zoloft.  Patient agreed to give a try to Zoloft.  We will start Zoloft 50 mg daily and patient will stop the Wellbutrin.  Discussed Zoloft side effects, benefits.  Recommended to call us back if she has any question, concern or if she feels worsening of the symptoms.  She may need dose adjustment and we will follow up in 6 weeks.  Follow Up Instructions:    I discussed the assessment and treatment plan with the patient. The patient was provided an opportunity to ask questions and all were answered. The patient agreed with the plan and demonstrated an understanding of the instructions.   The patient was advised to call back or seek an in-person evaluation if the symptoms worsen or if the condition fails to improve as anticipated.  I provided 18 minutes of non-face-to-face time during this encounter.  [name], MD

## 2020-09-24 ENCOUNTER — Other Ambulatory Visit (HOSPITAL_COMMUNITY): Payer: Self-pay

## 2020-09-25 ENCOUNTER — Other Ambulatory Visit (HOSPITAL_COMMUNITY): Payer: Self-pay

## 2020-10-21 ENCOUNTER — Other Ambulatory Visit (HOSPITAL_COMMUNITY): Payer: Self-pay

## 2020-10-27 ENCOUNTER — Other Ambulatory Visit (HOSPITAL_COMMUNITY): Payer: Self-pay | Admitting: *Deleted

## 2020-10-27 ENCOUNTER — Other Ambulatory Visit (HOSPITAL_COMMUNITY): Payer: Self-pay

## 2020-10-27 DIAGNOSIS — F419 Anxiety disorder, unspecified: Secondary | ICD-10-CM

## 2020-10-27 DIAGNOSIS — F330 Major depressive disorder, recurrent, mild: Secondary | ICD-10-CM

## 2020-10-27 MED ORDER — SERTRALINE HCL 50 MG PO TABS
50.0000 mg | ORAL_TABLET | Freq: Every day | ORAL | 0 refills | Status: DC
  Filled 2020-10-27: qty 9, 9d supply, fill #0

## 2020-10-29 ENCOUNTER — Other Ambulatory Visit (HOSPITAL_COMMUNITY): Payer: Self-pay

## 2020-11-04 ENCOUNTER — Other Ambulatory Visit: Payer: Self-pay

## 2020-11-04 ENCOUNTER — Encounter (HOSPITAL_COMMUNITY): Payer: Self-pay | Admitting: Psychiatry

## 2020-11-04 ENCOUNTER — Other Ambulatory Visit (HOSPITAL_COMMUNITY): Payer: Self-pay

## 2020-11-04 ENCOUNTER — Telehealth (INDEPENDENT_AMBULATORY_CARE_PROVIDER_SITE_OTHER): Payer: No Typology Code available for payment source | Admitting: Psychiatry

## 2020-11-04 DIAGNOSIS — F419 Anxiety disorder, unspecified: Secondary | ICD-10-CM

## 2020-11-04 DIAGNOSIS — F330 Major depressive disorder, recurrent, mild: Secondary | ICD-10-CM | POA: Diagnosis not present

## 2020-11-04 MED ORDER — SERTRALINE HCL 50 MG PO TABS
50.0000 mg | ORAL_TABLET | Freq: Every day | ORAL | 1 refills | Status: DC
  Filled 2020-11-04: qty 90, 90d supply, fill #0
  Filled 2021-02-02: qty 90, 90d supply, fill #1

## 2020-11-04 NOTE — Progress Notes (Signed)
Virtual Visit via Telephone Note  I connected with Donna Phillips on 11/04/20 at  3:40 PM EDT by telephone and verified that I am speaking with the correct person using two identifiers.  Location: Patient: Home Provider: Home Office   I discussed the limitations, risks, security and privacy concerns of performing an evaluation and management service by telephone and the availability of in person appointments. I also discussed with the patient that there may be a patient responsible charge related to this service. The patient expressed understanding and agreed to proceed.   History of Present Illness: Patient is evaluated by phone session.  She is not taking Zoloft as we switch the medication in the last visit because patient complaining of tremors with the Wellbutrin.  She is actually doing much better with the Zoloft and she is more calm and more relaxed.  Her tremors are nonexistent and she is happy about it.  She had a good time in [place] and also [place] where the family went for vacation.  Her 48-year-old daughter going to start [employer] and daughter is very excited about it.  Patient's appetite is okay.  She denies any crying spells, feeling of hopelessness or worthlessness.  She denies any anxiety or any panic attack.  She wants to keep the current dose of Zoloft.     Past Psychiatric History: Reviewed. H/O depression and anxiety.  Took Prozac from 2005-2013 until got pregnant.  Tried briefly Lexapro but causes side effects.  Wellbutrin caused tremors and headaches.  No h/o inpatient treatment, suicidal attempt or mania.   Psychiatric Specialty Exam: Physical Exam  Review of Systems  Weight 116 lb (52.6 kg).There is no height or weight on file to calculate BMI.  General Appearance: NA  Eye Contact:  NA  Speech:  Clear and Coherent and Normal Rate  Volume:  Normal  Mood:  Euthymic  Affect:  NA  Thought Process:  Goal Directed  Orientation:  Full (Time, Place, and  Person)  Thought Content:  WDL  Suicidal Thoughts:  No  Homicidal Thoughts:  No  Memory:  Immediate;   Good Recent;   Good Remote;   Good  Judgement:  Intact  Insight:  Present  Psychomotor Activity:  NA  Concentration:  Concentration: Good and Attention Span: Good  Recall:  Good  Fund of Knowledge:  Good  Language:  Good  Akathisia:  No  Handed:  Right  AIMS (if indicated):     Assets:  Communication Skills Desire for Improvement Housing Resilience Transportation  ADL's:  Intact  Cognition:  WNL  Sleep:   ok      Assessment and Plan: Major depressive disorder, recurrent.  Anxiety.  Patient doing better on Zoloft 50 mg.  She is no longer taking Wellbutrin which was causing tremors.  Discussed medication side effects and benefits.  Continue Zoloft 50 mg daily.  Recommended to call us back if she has any question or any concern.  Follow-up in 6 months.  Follow Up Instructions:    I discussed the assessment and treatment plan with the patient. The patient was provided an opportunity to ask questions and all were answered. The patient agreed with the plan and demonstrated an understanding of the instructions.   The patient was advised to call back or seek an in-person evaluation if the symptoms worsen or if the condition fails to improve as anticipated.  I provided 14 minutes of non-face-to-face time during this encounter.   Donna Nations, MD

## 2021-01-07 ENCOUNTER — Other Ambulatory Visit (HOSPITAL_COMMUNITY): Payer: Self-pay | Admitting: Internal Medicine

## 2021-01-07 DIAGNOSIS — Z1231 Encounter for screening mammogram for malignant neoplasm of breast: Secondary | ICD-10-CM

## 2021-01-13 ENCOUNTER — Other Ambulatory Visit (HOSPITAL_COMMUNITY): Payer: Self-pay

## 2021-01-19 ENCOUNTER — Other Ambulatory Visit (HOSPITAL_COMMUNITY): Payer: Self-pay

## 2021-01-28 ENCOUNTER — Ambulatory Visit (HOSPITAL_COMMUNITY)
Admission: RE | Admit: 2021-01-28 | Discharge: 2021-01-28 | Disposition: A | Discharge status: Home / Self Care | Payer: No Typology Code available for payment source | Source: Ambulatory Visit | Attending: Internal Medicine | Admitting: Internal Medicine

## 2021-01-28 ENCOUNTER — Other Ambulatory Visit: Payer: Self-pay

## 2021-01-28 DIAGNOSIS — Z1231 Encounter for screening mammogram for malignant neoplasm of breast: Secondary | ICD-10-CM | POA: Diagnosis present

## 2021-02-02 ENCOUNTER — Other Ambulatory Visit (HOSPITAL_COMMUNITY): Payer: Self-pay

## 2021-03-18 ENCOUNTER — Other Ambulatory Visit: Payer: Self-pay | Admitting: *Deleted

## 2021-03-18 DIAGNOSIS — Z1211 Encounter for screening for malignant neoplasm of colon: Secondary | ICD-10-CM

## 2021-03-26 ENCOUNTER — Other Ambulatory Visit (HOSPITAL_COMMUNITY): Payer: Self-pay

## 2021-03-26 ENCOUNTER — Ambulatory Visit (INDEPENDENT_AMBULATORY_CARE_PROVIDER_SITE_OTHER): Payer: No Typology Code available for payment source | Admitting: General Surgery

## 2021-03-26 ENCOUNTER — Other Ambulatory Visit: Payer: Self-pay

## 2021-03-26 ENCOUNTER — Encounter: Payer: Self-pay | Admitting: General Surgery

## 2021-03-26 VITALS — BP 151/77 | HR 100 | Temp 98.1°F | Resp 16 | Ht 63.0 in | Wt 119.0 lb

## 2021-03-26 DIAGNOSIS — Z1211 Encounter for screening for malignant neoplasm of colon: Secondary | ICD-10-CM

## 2021-03-26 MED ORDER — SUTAB 1479-225-188 MG PO TABS
1.0000 | ORAL_TABLET | Freq: Once | ORAL | 0 refills | Status: AC
  Filled 2021-03-26: qty 24, 1d supply, fill #0

## 2021-03-27 NOTE — Progress Notes (Addendum)
Donna Phillips; [id number]; May 12, 1970   HPI Patient is a 50 year old white female who was referred to my care by Dr. Asencion Noble for a screening colonoscopy.  Patient denies any family history of colon cancer, blood in her stools, abnormal diarrhea or constipation, or abnormal weight loss. Past Medical History:  Diagnosis Date   Abnormal Pap smear    AMA (advanced maternal age) multigravida 35+    Anxiety    Gestational diabetes    diet controlled   H/O varicella    HSV (herpes simplex virus) anogenital infection    PONV (postoperative nausea and vomiting)     Past Surgical History:  Procedure Laterality Date   CESAREAN SECTION  02/27/2012   Procedure: CESAREAN SECTION;  Surgeon: Linda Hedges, DO;  Location: [facility];  Service: Obstetrics;  Laterality: N/A;  Primary cesarean section with delivery of baby  girl at 11.  Apgars9/9.   HERNIA REPAIR  1974   LEEP     TONSILLECTOMY  1993    Family History  Problem Relation Age of Onset   Heart disease Paternal Uncle    Diabetes Maternal Grandfather    Heart disease Maternal Grandfather    Heart attack Paternal Grandmother    Heart attack Paternal Grandfather    Stroke Paternal Grandfather    Heart attack Father     Current Outpatient Medications on File Prior to Visit  Medication Sig Dispense Refill   Multiple Vitamins-Minerals (MULTIVITAMIN ADULT PO) Take by mouth.     norethindrone-ethinyl estradiol-iron (LOESTRIN FE) 1.5-30 MG-MCG tablet TAKE 1 TABLET BY MOUTH ONCE DAILY. 84 tablet 3   sertraline (ZOLOFT) 50 MG tablet Take 1 tablet (50 mg total) by mouth daily. 90 tablet 1   verapamil (VERELAN PM) 120 MG 24 hr capsule Take 1 capsule (120 mg total) by mouth daily. 90 capsule 4   No current facility-administered medications on file prior to visit.    Allergies  Allergen Reactions   Codeine Anaphylaxis    Pt said childhood rxn with throat swelling per her mother, tolerated morphine in the OR w/o any problems.  Md said ok to try  dilaudid   Penicillins Other (See Comments)    Doesn't seem to work    Social History   Substance and Sexual Activity  Alcohol Use No    Social History   Tobacco Use  Smoking Status Never  Smokeless Tobacco Never    Review of Systems  Constitutional: Negative.   HENT: Negative.    Eyes: Negative.   Respiratory: Negative.    Cardiovascular: Negative.   Gastrointestinal: Negative.   Genitourinary: Negative.   Musculoskeletal: Negative.   Skin: Negative.   Neurological: Negative.   Endo/Heme/Allergies: Negative.   Psychiatric/Behavioral: Negative.     Objective   Vitals:   03/26/21 1200  BP: (!) 151/77  Pulse: 100  Resp: 16  Temp: 98.1 F (36.7 C)  SpO2: 98%    Physical Exam Vitals reviewed.  Constitutional:      Appearance: Normal appearance. She is normal weight. She is not ill-appearing.  HENT:     Head: Normocephalic and atraumatic.  Cardiovascular:     Rate and Rhythm: Normal rate and regular rhythm.     Heart sounds: Normal heart sounds. No murmur heard.   No friction rub. No gallop.  Pulmonary:     Effort: Pulmonary effort is normal. No respiratory distress.     Breath sounds: Normal breath sounds. No stridor. No wheezing, rhonchi or rales.  Abdominal:  General: Abdomen is flat. Bowel sounds are normal. There is no distension.     Palpations: Abdomen is soft. There is no mass.     Tenderness: There is no abdominal tenderness. There is no guarding or rebound.     Hernia: No hernia is present.  Skin:    General: Skin is warm and dry.  Neurological:     Mental Status: She is alert and oriented to person, place, and time.  Primary care notes reviewed  Assessment  Need for screening colonoscopy Plan  Patient will be scheduled for screening colonoscopy.  The risks and benefits of the procedure including bleeding and perforation were fully explained to the patient, who gave informed consent.  Sutabs have been prescribed for bowel preparation.

## 2021-04-03 NOTE — H&P (Signed)
Donna Phillips; [id number]; 10-26-1970   HPI Patient is a 51 year old white female who was referred to my care by Dr. Asencion Noble for a screening colonoscopy.  Patient denies any family history of colon cancer, blood in her stools, abnormal diarrhea or constipation, or abnormal weight loss. Past Medical History:  Diagnosis Date   Abnormal Pap smear    AMA (advanced maternal age) multigravida 35+    Anxiety    Gestational diabetes    diet controlled   H/O varicella    HSV (herpes simplex virus) anogenital infection    PONV (postoperative nausea and vomiting)     Past Surgical History:  Procedure Laterality Date   CESAREAN SECTION  02/27/2012   Procedure: CESAREAN SECTION;  Surgeon: Linda Hedges, DO;  Location: [facility];  Service: Obstetrics;  Laterality: N/A;  Primary cesarean section with delivery of baby  girl at 59.  Apgars9/9.   HERNIA REPAIR  1974   LEEP     TONSILLECTOMY  1993    Family History  Problem Relation Age of Onset   Heart disease Paternal Uncle    Diabetes Maternal Grandfather    Heart disease Maternal Grandfather    Heart attack Paternal Grandmother    Heart attack Paternal Grandfather    Stroke Paternal Grandfather    Heart attack Father     Current Outpatient Medications on File Prior to Visit  Medication Sig Dispense Refill   Multiple Vitamins-Minerals (MULTIVITAMIN ADULT PO) Take by mouth.     norethindrone-ethinyl estradiol-iron (LOESTRIN FE) 1.5-30 MG-MCG tablet TAKE 1 TABLET BY MOUTH ONCE DAILY. 84 tablet 3   sertraline (ZOLOFT) 50 MG tablet Take 1 tablet (50 mg total) by mouth daily. 90 tablet 1   verapamil (VERELAN PM) 120 MG 24 hr capsule Take 1 capsule (120 mg total) by mouth daily. 90 capsule 4   No current facility-administered medications on file prior to visit.    Allergies  Allergen Reactions   Codeine Anaphylaxis    Pt said childhood rxn with throat swelling per her mother, tolerated morphine in the OR w/o any problems.  Md said ok to try  dilaudid   Penicillins Other (See Comments)    Doesn't seem to work    Social History   Substance and Sexual Activity  Alcohol Use No    Social History   Tobacco Use  Smoking Status Never  Smokeless Tobacco Never    Review of Systems  Constitutional: Negative.   HENT: Negative.    Eyes: Negative.   Respiratory: Negative.    Cardiovascular: Negative.   Gastrointestinal: Negative.   Genitourinary: Negative.   Musculoskeletal: Negative.   Skin: Negative.   Neurological: Negative.   Endo/Heme/Allergies: Negative.   Psychiatric/Behavioral: Negative.     Objective   Vitals:   03/26/21 1200  BP: (!) 151/77  Pulse: 100  Resp: 16  Temp: 98.1 F (36.7 C)  SpO2: 98%    Physical Exam Vitals reviewed.  Constitutional:      Appearance: Normal appearance. She is normal weight. She is not ill-appearing.  HENT:     Head: Normocephalic and atraumatic.  Cardiovascular:     Rate and Rhythm: Normal rate and regular rhythm.     Heart sounds: Normal heart sounds. No murmur heard.   No friction rub. No gallop.  Pulmonary:     Effort: Pulmonary effort is normal. No respiratory distress.     Breath sounds: Normal breath sounds. No stridor. No wheezing, rhonchi or rales.  Abdominal:  General: Abdomen is flat. Bowel sounds are normal. There is no distension.     Palpations: Abdomen is soft. There is no mass.     Tenderness: There is no abdominal tenderness. There is no guarding or rebound.     Hernia: No hernia is present.  Skin:    General: Skin is warm and dry.  Neurological:     Mental Status: She is alert and oriented to person, place, and time.  Primary care notes reviewed  Assessment  Need for screening colonoscopy Plan  Patient will be scheduled for screening colonoscopy.  The risks and benefits of the procedure including bleeding and perforation were fully explained to the patient, who gave informed consent.  Sutabs have been prescribed for bowel preparation.

## 2021-04-06 ENCOUNTER — Other Ambulatory Visit (HOSPITAL_COMMUNITY): Payer: Self-pay

## 2021-04-14 ENCOUNTER — Other Ambulatory Visit (HOSPITAL_COMMUNITY): Payer: Self-pay

## 2021-04-17 NOTE — Patient Instructions (Signed)
Donna Phillips  04/17/2021     @PREFPERIOPPHARMACY @   Your procedure is scheduled on  04/23/2021.   Report to [facility] at  [phone] A.M.   Call this number if you have problems the morning of surgery:  [phone]   Remember:  Follow the diet and prep instructions given to you by the office.    Take these medicines the morning of surgery with A SIP OF WATER                                    zoloft, verapamil    Do not wear jewelry, make-up or nail polish.  Do not wear lotions, powders, or perfumes, or deodorant.  Do not shave 48 hours prior to surgery.  Men may shave face and neck.  Do not bring valuables to the hospital.  [facility] is not responsible for any belongings or valuables.  Contacts, dentures or bridgework may not be worn into surgery.  Leave your suitcase in the car.  After surgery it may be brought to your room.  For patients admitted to the hospital, discharge time will be determined by your treatment team.  Patients discharged the day of surgery will not be allowed to drive home and must have someone with them for 24 hours.    Special instructions:   DO NOT smoke tobacco or vape for 24 hours before your procedure.  Please read over the following fact sheets that you were given. Anesthesia Post-op Instructions and Care and Recovery After Surgery      Colonoscopy, Adult, Care After This sheet gives you information about how to care for yourself after your procedure. Your health care provider may also give you more specific instructions. If you have problems or questions, contact your health care provider. What can I expect after the procedure? After the procedure, it is common to have: A small amount of blood in your stool for 24 hours after the procedure. Some gas. Mild cramping or bloating of your abdomen. Follow these instructions at home: Eating and drinking  Drink enough fluid to keep your urine pale yellow. Follow instructions from your  health care provider about eating or drinking restrictions. Resume your normal diet as instructed by your health care provider. Avoid heavy or fried foods that are hard to digest. Activity Rest as told by your health care provider. Avoid sitting for a long time without moving. Get up to take short walks every 1-2 hours. This is important to improve blood flow and breathing. Ask for help if you feel weak or unsteady. Return to your normal activities as told by your health care provider. Ask your health care provider what activities are safe for you. Managing cramping and bloating  Try walking around when you have cramps or feel bloated. Apply heat to your abdomen as told by your health care provider. Use the heat source that your health care provider recommends, such as a moist heat pack or a heating pad. Place a towel between your skin and the heat source. Leave the heat on for 20-30 minutes. Remove the heat if your skin turns bright red. This is especially important if you are unable to feel pain, heat, or cold. You may have a greater risk of getting burned. General instructions If you were given a sedative during the procedure, it can affect you for several hours. Do not drive  or operate machinery until your health care provider says that it is safe. For the first 24 hours after the procedure: Do not sign important documents. Do not drink alcohol. Do your regular daily activities at a slower pace than normal. Eat soft foods that are easy to digest. Take over-the-counter and prescription medicines only as told by your health care provider. Keep all follow-up visits as told by your health care provider. This is important. Contact a health care provider if: You have blood in your stool 2-3 days after the procedure. Get help right away if you have: More than a small spotting of blood in your stool. Large blood clots in your stool. Swelling of your abdomen. Nausea or vomiting. A  fever. Increasing pain in your abdomen that is not relieved with medicine. Summary After the procedure, it is common to have a small amount of blood in your stool. You may also have mild cramping and bloating of your abdomen. If you were given a sedative during the procedure, it can affect you for several hours. Do not drive or operate machinery until your health care provider says that it is safe. Get help right away if you have a lot of blood in your stool, nausea or vomiting, a fever, or increased pain in your abdomen. This information is not intended to replace advice given to you by your health care provider. Make sure you discuss any questions you have with your health care provider. Document Revised: 01/19/2019 Document Reviewed: 10/09/2018 Elsevier Patient Education  [place] After This sheet gives you information about how to care for yourself after your procedure. Your health care provider may also give you more specific instructions. If you have problems or questions, contact your health care provider. What can I expect after the procedure? After the procedure, it is common to have: Tiredness. Forgetfulness about what happened after the procedure. Impaired judgment for important decisions. Nausea or vomiting. Some difficulty with balance. Follow these instructions at home: For the time period you were told by your health care provider:   Rest as needed. Do not participate in activities where you could fall or become injured. Do not drive or use machinery. Do not drink alcohol. Do not take sleeping pills or medicines that cause drowsiness. Do not make important decisions or sign legal documents. Do not take care of children on your own. Eating and drinking Follow the diet that is recommended by your health care provider. Drink enough fluid to keep your urine pale yellow. If you vomit: Drink water, juice, or soup when you can drink  without vomiting. Make sure you have little or no nausea before eating solid foods. General instructions Have a responsible adult stay with you for the time you are told. It is important to have someone help care for you until you are awake and alert. Take over-the-counter and prescription medicines only as told by your health care provider. If you have sleep apnea, surgery and certain medicines can increase your risk for breathing problems. Follow instructions from your health care provider about wearing your sleep device: Anytime you are sleeping, including during daytime naps. While taking prescription pain medicines, sleeping medicines, or medicines that make you drowsy. Avoid smoking. Keep all follow-up visits as told by your health care provider. This is important. Contact a health care provider if: You keep feeling nauseous or you keep vomiting. You feel light-headed. You are still sleepy or having trouble with balance after 24 hours.  You develop a rash. You have a fever. You have redness or swelling around the IV site. Get help right away if: You have trouble breathing. You have new-onset confusion at home. Summary For several hours after your procedure, you may feel tired. You may also be forgetful and have poor judgment. Have a responsible adult stay with you for the time you are told. It is important to have someone help care for you until you are awake and alert. Rest as told. Do not drive or operate machinery. Do not drink alcohol or take sleeping pills. Get help right away if you have trouble breathing, or if you suddenly become confused. This information is not intended to replace advice given to you by your health care provider. Make sure you discuss any questions you have with your health care provider. Document Revised: 11/29/2019 Document Reviewed: 02/15/2019 Elsevier Patient Education  2022 [employer].

## 2021-04-20 ENCOUNTER — Other Ambulatory Visit (HOSPITAL_COMMUNITY): Payer: Self-pay

## 2021-04-21 ENCOUNTER — Encounter (HOSPITAL_COMMUNITY)
Admission: RE | Admit: 2021-04-21 | Discharge: 2021-04-21 | Disposition: A | Discharge status: Home / Self Care | Payer: No Typology Code available for payment source | Source: Ambulatory Visit | Attending: General Surgery | Admitting: General Surgery

## 2021-04-21 ENCOUNTER — Encounter (HOSPITAL_COMMUNITY): Payer: Self-pay

## 2021-04-21 ENCOUNTER — Other Ambulatory Visit: Payer: Self-pay

## 2021-04-21 VITALS — BP 136/82 | HR 92 | Temp 98.7°F | Resp 16 | Ht 63.0 in | Wt 118.0 lb

## 2021-04-21 DIAGNOSIS — Z01818 Encounter for other preprocedural examination: Secondary | ICD-10-CM | POA: Diagnosis present

## 2021-04-21 HISTORY — DX: Essential (primary) hypertension: I10

## 2021-04-21 LAB — PREGNANCY, URINE: Preg Test, Ur: NEGATIVE

## 2021-04-23 ENCOUNTER — Encounter (HOSPITAL_COMMUNITY)
Admission: RE | Disposition: A | Discharge status: Home / Self Care | Payer: Self-pay | Source: Home / Self Care | Attending: General Surgery

## 2021-04-23 ENCOUNTER — Ambulatory Visit (HOSPITAL_COMMUNITY): Payer: No Typology Code available for payment source | Admitting: Anesthesiology

## 2021-04-23 ENCOUNTER — Encounter (HOSPITAL_COMMUNITY): Payer: Self-pay | Admitting: General Surgery

## 2021-04-23 ENCOUNTER — Ambulatory Visit (HOSPITAL_COMMUNITY)
Admission: RE | Admit: 2021-04-23 | Discharge: 2021-04-23 | Disposition: A | Discharge status: Home / Self Care | Payer: No Typology Code available for payment source | Attending: General Surgery | Admitting: General Surgery

## 2021-04-23 ENCOUNTER — Other Ambulatory Visit: Payer: Self-pay

## 2021-04-23 DIAGNOSIS — D124 Benign neoplasm of descending colon: Secondary | ICD-10-CM | POA: Diagnosis not present

## 2021-04-23 DIAGNOSIS — Z1211 Encounter for screening for malignant neoplasm of colon: Secondary | ICD-10-CM

## 2021-04-23 DIAGNOSIS — Z79899 Other long term (current) drug therapy: Secondary | ICD-10-CM | POA: Diagnosis not present

## 2021-04-23 DIAGNOSIS — F419 Anxiety disorder, unspecified: Secondary | ICD-10-CM | POA: Diagnosis not present

## 2021-04-23 DIAGNOSIS — D125 Benign neoplasm of sigmoid colon: Secondary | ICD-10-CM | POA: Diagnosis not present

## 2021-04-23 DIAGNOSIS — F32A Depression, unspecified: Secondary | ICD-10-CM | POA: Insufficient documentation

## 2021-04-23 DIAGNOSIS — I10 Essential (primary) hypertension: Secondary | ICD-10-CM | POA: Diagnosis not present

## 2021-04-23 HISTORY — PX: COLONOSCOPY WITH PROPOFOL: SHX5780

## 2021-04-23 HISTORY — PX: POLYPECTOMY: SHX5525

## 2021-04-23 SURGERY — COLONOSCOPY WITH PROPOFOL
Anesthesia: General

## 2021-04-23 MED ORDER — CHLORHEXIDINE GLUCONATE CLOTH 2 % EX PADS: 6.0000 | MEDICATED_PAD | Freq: Once | CUTANEOUS | Status: DC

## 2021-04-23 MED ORDER — PROPOFOL 500 MG/50ML IV EMUL
INTRAVENOUS | Status: AC
  Filled 2021-04-23: qty 50

## 2021-04-23 MED ORDER — PROPOFOL 10 MG/ML IV BOLUS
INTRAVENOUS | Status: DC | PRN
  Administered 2021-04-23: 60 mg via INTRAVENOUS
  Administered 2021-04-23: 30 mg via INTRAVENOUS
  Administered 2021-04-23: 50 mg via INTRAVENOUS
  Administered 2021-04-23: 25 mg via INTRAVENOUS

## 2021-04-23 MED ORDER — PROPOFOL 10 MG/ML IV BOLUS
INTRAVENOUS | Status: AC
  Filled 2021-04-23: qty 20

## 2021-04-23 MED ORDER — PROPOFOL 500 MG/50ML IV EMUL
INTRAVENOUS | Status: DC | PRN
  Administered 2021-04-23: 150 ug/kg/min via INTRAVENOUS

## 2021-04-23 MED ORDER — LACTATED RINGERS IV SOLN: INTRAVENOUS | Status: DC

## 2021-04-23 MED ORDER — LIDOCAINE HCL 1 % IJ SOLN
INTRAMUSCULAR | Status: DC | PRN
  Administered 2021-04-23: 50 mg via INTRADERMAL

## 2021-04-23 MED ORDER — PROPOFOL 1000 MG/100ML IV EMUL
INTRAVENOUS | Status: AC
  Filled 2021-04-23: qty 100

## 2021-04-23 NOTE — Interval H&P Note (Signed)
History and Physical Interval Note:  04/23/2021 7:24 AM  Donna Phillips  has presented today for surgery, with the diagnosis of Screening.  The various methods of treatment have been discussed with the patient and family. After consideration of risks, benefits and other options for treatment, the patient has consented to  Procedure(s): COLONOSCOPY WITH PROPOFOL (N/A) as a surgical intervention.  The patient's history has been reviewed, patient examined, no change in status, stable for surgery.  I have reviewed the patient's chart and labs.  Questions were answered to the patient's satisfaction.     [facility] Signs

## 2021-04-23 NOTE — Anesthesia Preprocedure Evaluation (Signed)
Anesthesia Evaluation  Patient identified by MRN, date of birth, ID band Patient awake    Reviewed: Allergy & Precautions, H&P , NPO status , Patient's Chart, lab work & pertinent test results, reviewed documented beta blocker date and time   History of Anesthesia Complications (+) PONV  Airway Mallampati: II  TM Distance: >3 FB Neck ROM: full    Dental no notable dental hx.    Pulmonary neg pulmonary ROS,    Pulmonary exam normal breath sounds clear to auscultation       Cardiovascular Exercise Tolerance: Good hypertension, negative cardio ROS   Rhythm:regular Rate:Normal     Neuro/Psych PSYCHIATRIC DISORDERS Anxiety Depression negative neurological ROS     GI/Hepatic negative GI ROS, Neg liver ROS,   Endo/Other  negative endocrine ROS  Renal/GU negative Renal ROS  negative genitourinary   Musculoskeletal   Abdominal   Peds  Hematology negative hematology ROS (+)   Anesthesia Other Findings   Reproductive/Obstetrics negative OB ROS                             Anesthesia Physical Anesthesia Plan  ASA: 2  Anesthesia Plan: General   Post-op Pain Management:    Induction:   PONV Risk Score and Plan: Propofol infusion  Airway Management Planned:   Additional Equipment:   Intra-op Plan:   Post-operative Plan:   Informed Consent: I have reviewed the patients History and Physical, chart, labs and discussed the procedure including the risks, benefits and alternatives for the proposed anesthesia with the patient or authorized representative who has indicated his/her understanding and acceptance.     Dental Advisory Given  Plan Discussed with: CRNA  Anesthesia Plan Comments:         Anesthesia Quick Evaluation

## 2021-04-23 NOTE — Transfer of Care (Signed)
Immediate Anesthesia Transfer of Care Note  Patient: Donna Phillips  Procedure(s) Performed: COLONOSCOPY WITH PROPOFOL POLYPECTOMY  Patient Location: Short Stay  Anesthesia Type:General  Level of Consciousness: awake  Airway & Oxygen Therapy: Patient Spontanous Breathing  Post-op Assessment: Report given to RN  Post vital signs: Reviewed and stable  Last Vitals:  Vitals Value Taken Time  BP 102/64 04/23/21 0805  Temp 36.6 C 04/23/21 0805  Pulse 88 04/23/21 0805  Resp 24 04/23/21 0805  SpO2 99 % 04/23/21 0805    Last Pain:  Vitals:   04/23/21 0805  TempSrc: Oral  PainSc: 0-No pain      Patients Stated Pain Goal: 5 (29/92/42 6834)  Complications: No notable events documented.

## 2021-04-23 NOTE — Op Note (Signed)
[facility] Patient Name: Donna Phillips Procedure Date: 04/23/2021 7:14 AM MRN: [id number] Date of Birth: [birth year] Attending MD: Aviva Signs , MD CSN: [id number] Age: 51 Admit Type: Outpatient Procedure:                Colonoscopy Indications:              Screening for colorectal malignant neoplasm Providers:                Aviva Signs, MD, Caprice Kluver, Randa Spike,                            Technician Referring MD:              Medicines:                Propofol per Anesthesia Complications:            No immediate complications. Estimated Blood Loss:     Estimated blood loss: none. Procedure:                Pre-Anesthesia Assessment:                           - Prior to the procedure, a History and Physical                            was performed, and patient medications and                            allergies were reviewed. The patient is competent.                            The risks and benefits of the procedure and the                            sedation options and risks were discussed with the                            patient. All questions were answered and informed                            consent was obtained. Patient identification and                            proposed procedure were verified by the physician,                            the nurse, the anesthetist and the technician in                            the endoscopy suite. Mental Status Examination:                            alert and oriented. Airway Examination: normal                            oropharyngeal airway  and neck mobility. Respiratory                            Examination: clear to auscultation. CV Examination:                            RRR, no murmurs, no S3 or S4. Prophylactic                            Antibiotics: The patient does not require                            prophylactic antibiotics. Prior Anticoagulants: The                            patient has taken no  previous anticoagulant or                            antiplatelet agents. ASA Grade Assessment: I - A                            normal, healthy patient. After reviewing the risks                            and benefits, the patient was deemed in                            satisfactory condition to undergo the procedure.                            The anesthesia plan was to use monitored anesthesia                            care (MAC). Immediately prior to administration of                            medications, the patient was re-assessed for                            adequacy to receive sedatives. The heart rate,                            respiratory rate, oxygen saturations, blood                            pressure, adequacy of pulmonary ventilation, and                            response to care were monitored throughout the                            procedure. The physical status of the patient was  re-assessed after the procedure.                           After obtaining informed consent, the colonoscope                            was passed under direct vision. Throughout the                            procedure, the patient's blood pressure, pulse, and                            oxygen saturations were monitored continuously. The                            [id number]) scope was introduced through the                            anus and advanced to the the cecum, identified by                            the appendiceal orifice, ileocecal valve and                            palpation. No anatomical landmarks were                            photographed. The entire colon was well visualized.                            The colonoscopy was performed without difficulty.                            The quality of the bowel preparation was adequate.                            The total duration of the procedure was 12 minutes. Scope In: 7:38:09  AM Scope Out: 7:56:37 AM Scope Withdrawal Time: 0 hours 6 minutes 21 seconds  Total Procedure Duration: 0 hours 18 minutes 28 seconds  Findings:      The perianal and digital rectal examinations were normal.      A 4 mm polyp was found in the proximal sigmoid colon. The polyp was       pedunculated. The polyp was removed with a hot snare. Resection and       retrieval were complete. Estimated blood loss: none.      The entire examined colon appeared normal. Estimated blood loss: none.      The entire examined colon appeared normal on direct and retroflexion       views. Impression:               - One 4 mm polyp in the proximal sigmoid colon,                            removed with a hot snare. Resected and retrieved.                           -  The entire examined colon is normal on direct and                            retroflexion views. Moderate Sedation:      Moderate (conscious) sedation was personally administered by an       anesthesia professional. The following parameters were monitored: oxygen       saturation, heart rate, blood pressure, and response to care. Recommendation:           - Written discharge instructions were provided to                            the patient.                           - The signs and symptoms of potential delayed                            complications were discussed with the patient.                           - Patient has a contact number available for                            emergencies.                           - Return to normal activities tomorrow.                           - Resume previous diet.                           - Continue present medications.                           - Repeat colonoscopy is recommended [Repeat                            reason]. The colonoscopy date will be determined                            after pathology results from today's exam become                            available for review. Procedure  Code(s):        --- Professional ---                           [phone], Colonoscopy, flexible; with removal of                            tumor(s), polyp(s), or other lesion(s) by snare                            technique Diagnosis Code(s):        --- Professional ---  Z12.11, Encounter for screening for malignant                            neoplasm of colon                           K63.5, Polyp of colon CPT copyright 2019 American Medical Association. All rights reserved. The codes documented in this report are preliminary and upon coder review may  be revised to meet current compliance requirements. Aviva Signs, MD Aviva Signs, MD 04/23/2021 8:08:16 AM This report has been signed electronically. Number of Addenda: 0

## 2021-04-23 NOTE — Anesthesia Postprocedure Evaluation (Signed)
Anesthesia Post Note  Patient: Donna Phillips  Procedure(s) Performed: COLONOSCOPY WITH PROPOFOL POLYPECTOMY  Patient location during evaluation: Short Stay Anesthesia Type: General Level of consciousness: awake and alert Pain management: pain level controlled Vital Signs Assessment: post-procedure vital signs reviewed and stable Respiratory status: spontaneous breathing Cardiovascular status: blood pressure returned to baseline and stable Postop Assessment: no apparent nausea or vomiting Anesthetic complications: no   No notable events documented.   Last Vitals:  Vitals:   04/23/21 0707 04/23/21 0805  BP: (!) 143/94 102/64  Pulse: 100 88  Resp: 13 (!) 24  Temp: 36.8 C 36.6 C  SpO2: 99% 99%    Last Pain:  Vitals:   04/23/21 0805  TempSrc: Oral  PainSc: 0-No pain                 Koralyn Prestage

## 2021-04-24 LAB — SURGICAL PATHOLOGY

## 2021-04-27 ENCOUNTER — Encounter (HOSPITAL_COMMUNITY): Payer: Self-pay | Admitting: General Surgery

## 2021-05-04 ENCOUNTER — Other Ambulatory Visit (HOSPITAL_COMMUNITY): Payer: Self-pay

## 2021-05-04 ENCOUNTER — Telehealth (HOSPITAL_BASED_OUTPATIENT_CLINIC_OR_DEPARTMENT_OTHER): Payer: No Typology Code available for payment source | Admitting: Psychiatry

## 2021-05-04 ENCOUNTER — Encounter (HOSPITAL_COMMUNITY): Payer: Self-pay | Admitting: Psychiatry

## 2021-05-04 ENCOUNTER — Other Ambulatory Visit: Payer: Self-pay

## 2021-05-04 DIAGNOSIS — F419 Anxiety disorder, unspecified: Secondary | ICD-10-CM | POA: Diagnosis not present

## 2021-05-04 DIAGNOSIS — F330 Major depressive disorder, recurrent, mild: Secondary | ICD-10-CM | POA: Diagnosis not present

## 2021-05-04 MED ORDER — SERTRALINE HCL 50 MG PO TABS
50.0000 mg | ORAL_TABLET | Freq: Every day | ORAL | 1 refills | Status: DC
  Filled 2021-05-04: qty 90, 90d supply, fill #0
  Filled 2021-08-01: qty 90, 90d supply, fill #1

## 2021-05-04 NOTE — Progress Notes (Signed)
Virtual Visit via Video Note  I connected with Donna Phillips on 05/04/21 at  8:40 AM EST by a video enabled telemedicine application and verified that I am speaking with the correct person using two identifiers.  Location: Patient: Home Provider: Home Office   I discussed the limitations of evaluation and management by telemedicine and the availability of in person appointments. The patient expressed understanding and agreed to proceed.  History of Present Illness: Patient is evaluated by video session.  She is compliant with Zoloft 50 mg was helping her anxiety.  She denies any crying spells or any feeling of hopelessness or worthlessness.  Her Christmas and holidays of were quite but good as they stay home.  She is happy that her daughter is doing very well at [employer] and able to make friends and now she is singing and having recently audition and did very well on orientation.  Patient sleeping good.  She denies any hopelessness or worthlessness.  She denies any panic attacks.  She admitted few pounds weight gain as not going outside due to cold weather.  However she has plan to resume walking once temperature get better.  She has no tremor or shakes or any EPS.  She is no longer taking Wellbutrin which was causing tremors.  Recently she had a colonoscopy and she was told there was a single polyp which was removed.  No new medication added.  She is active and her energy level is good.  Past Psychiatric History: Reviewed. H/O depression and anxiety.  Took Prozac from 2005-2013 until got pregnant.  Tried briefly Lexapro but causes side effects.  Wellbutrin caused tremors and headaches.  No h/o inpatient treatment, suicidal attempt or mania.   Psychiatric Specialty Exam: Physical Exam  Review of Systems  Weight 118 lb (53.5 kg), last menstrual period 04/14/2021.There is no height or weight on file to calculate BMI.  General Appearance: Well Groomed  Eye Contact:  Good  Speech:  Clear  and Coherent  Volume:  Normal  Mood:   good  Affect:  Congruent  Thought Process:  Goal Directed  Orientation:  Full (Time, Place, and Person)  Thought Content:  Logical  Suicidal Thoughts:  No  Homicidal Thoughts:  No  Memory:  Immediate;   Good Recent;   Good Remote;   Good  Judgement:  Intact  Insight:  Present  Psychomotor Activity:  Normal  Concentration:  Concentration: Good and Attention Span: Good  Recall:  Good  Fund of Knowledge:  Good  Language:  Good  Akathisia:  No  Handed:  Right  AIMS (if indicated):     Assets:  Communication Skills Desire for [place] [employer] Transportation  ADL's:  Intact  Cognition:  WNL  Sleep:   ok      Assessment and Plan: Major depressive disorder, recurrent.  Anxiety.  Patient doing well on Zoloft 50 mg daily.  She has no tremor, shakes or EPS.  Recommended to call us back if she is any question or any concern.  Follow-up in 6 months.  Follow Up Instructions:    I discussed the assessment and treatment plan with the patient. The patient was provided an opportunity to ask questions and all were answered. The patient agreed with the plan and demonstrated an understanding of the instructions.   The patient was advised to call back or seek an in-person evaluation if the symptoms worsen or if the condition fails to improve as anticipated.  I  provided 18 minutes of non-face-to-face time during this encounter.   [name], MD

## 2021-07-06 ENCOUNTER — Other Ambulatory Visit (HOSPITAL_COMMUNITY): Payer: Self-pay

## 2021-07-06 MED ORDER — NORETHIN ACE-ETH ESTRAD-FE 1.5-30 MG-MCG PO TABS
1.0000 | ORAL_TABLET | Freq: Every day | ORAL | 0 refills | Status: DC
  Filled 2021-07-06: qty 84, 84d supply, fill #0

## 2021-07-20 ENCOUNTER — Other Ambulatory Visit (HOSPITAL_COMMUNITY): Payer: Self-pay

## 2021-08-03 ENCOUNTER — Other Ambulatory Visit (HOSPITAL_COMMUNITY): Payer: Self-pay

## 2021-08-03 MED ORDER — NORETHIN ACE-ETH ESTRAD-FE 1.5-30 MG-MCG PO TABS
1.0000 | ORAL_TABLET | Freq: Every day | ORAL | 3 refills | Status: DC
  Filled 2021-08-03 – 2021-09-25 (×2): qty 84, 84d supply, fill #0
  Filled 2021-12-21: qty 84, 84d supply, fill #1
  Filled 2022-03-14: qty 84, 84d supply, fill #2
  Filled 2022-04-11: qty 84, 84d supply, fill #3
  Filled 2022-06-07: qty 84, 84d supply, fill #0
  Filled 2022-06-07: qty 84, 84d supply, fill #3
  Filled 2022-06-07: qty 84, 84d supply, fill #0

## 2021-08-28 ENCOUNTER — Telehealth (HOSPITAL_COMMUNITY): Payer: Self-pay | Admitting: *Deleted

## 2021-08-28 NOTE — Telephone Encounter (Signed)
Part two of message don't know why can't edit. Just wanted to add that pt last had labs in January 2023 from PCP.

## 2021-08-28 NOTE — Telephone Encounter (Signed)
Writer spoke with pt who called with c/o weight gain, dry mouth, which she attributes to the Zoloft 50 mg despite having been on med for some time. Pt states that she works out on a regular basis but the weight gain is not muscle is more "puffy, bloated, clothes tight". Pt denies any rashes, fevers, stiffness. Pt prefer to ask you rather than covering MD as this is not an emergency and also she has established relationship with you. Pt agrees to go to UC, ED if she develops other symptoms such as those mentioned. Pt next appointment scheduled for 11/02/21.

## 2021-08-31 ENCOUNTER — Telehealth (HOSPITAL_COMMUNITY): Payer: Self-pay | Admitting: *Deleted

## 2021-08-31 NOTE — Telephone Encounter (Signed)
I talked with the patient.  She is willing to try Trintellix sample 5 mg started for 1 week and then 10 mg.  I explained cross titration that she will cut down the Zoloft 25 mg for 1 week while taking the Trintellix 5 mg and then stopped the Zoloft when she go up to 10 mg.  We will provide Trintellix samples.

## 2021-08-31 NOTE — Telephone Encounter (Signed)
I tried to call her back but she did not pick up the phone and left a message to call us back.  Wanted to try Trintellix but like to discuss with the patient before calling to pharmacy.

## 2021-08-31 NOTE — Telephone Encounter (Signed)
Pt LVM regarding "new medication" that you'd discussed. Writer spoke with pt who says her insurance doesn't cover Trintellix and sounds like she wasn't happy about Zoloft. She is available for t/c.

## 2021-09-02 ENCOUNTER — Other Ambulatory Visit (HOSPITAL_COMMUNITY): Payer: Self-pay | Admitting: *Deleted

## 2021-09-02 ENCOUNTER — Telehealth (HOSPITAL_COMMUNITY): Payer: Self-pay | Admitting: *Deleted

## 2021-09-02 ENCOUNTER — Other Ambulatory Visit (HOSPITAL_COMMUNITY): Payer: Self-pay

## 2021-09-02 MED ORDER — DULOXETINE HCL 20 MG PO CPEP
20.0000 mg | ORAL_CAPSULE | Freq: Every day | ORAL | 1 refills | Status: DC
Start: 2021-09-02 — End: 2021-11-02
  Filled 2021-09-02: qty 30, 30d supply, fill #0
  Filled 2021-09-25: qty 30, 30d supply, fill #1

## 2021-09-02 NOTE — Telephone Encounter (Signed)
Discussed pt assistance for Trintellix. Writer advised what the financial requirements are and pt says she does not qualify and does not want to rely on samples. Co pay is $300. Pt suggested possibly going down om dosage to 25 mg. Writer and pt discussed that s/e may be lessened however so might efficacy. Please review and advise.

## 2021-09-02 NOTE — Telephone Encounter (Signed)
Pt agrees to start Cymbalta 20 mg. Script sent to [facility]. Pt will call in about to two weeks to check in. Pt encouraged to call sooner if she has concerns, questions after starting new med. Pt agrees.

## 2021-09-02 NOTE — Telephone Encounter (Signed)
She can try Cymbalta 20 mg which she has never tried before.  If you agree then please call the pharmacy.  If she does not want to try a new medication then she can reduce Zoloft to just 25 mg but watch carefully signs and symptoms and if depression come back then she need to call us back.

## 2021-09-16 ENCOUNTER — Telehealth (HOSPITAL_COMMUNITY): Payer: Self-pay | Admitting: *Deleted

## 2021-09-16 NOTE — Telephone Encounter (Signed)
Pt called to share that the Cymbalta 20 mg is working very well without issue. Pt agrees to call back with any questions or concerns. Next scheduled appointment 11/02/21. FYI.

## 2021-09-25 ENCOUNTER — Other Ambulatory Visit (HOSPITAL_COMMUNITY): Payer: Self-pay

## 2021-09-28 ENCOUNTER — Other Ambulatory Visit (HOSPITAL_COMMUNITY): Payer: Self-pay

## 2021-10-19 ENCOUNTER — Other Ambulatory Visit (HOSPITAL_COMMUNITY): Payer: Self-pay

## 2021-10-19 MED ORDER — VERAPAMIL HCL ER 120 MG PO CP24
120.0000 mg | ORAL_CAPSULE | Freq: Every day | ORAL | 4 refills | Status: AC
Start: 2021-10-19 — End: ?
  Filled 2021-10-19: qty 90, 90d supply, fill #0
  Filled 2022-01-16: qty 90, 90d supply, fill #1
  Filled 2022-04-11 – 2022-04-16 (×2): qty 90, 90d supply, fill #2
  Filled 2022-07-16: qty 90, 90d supply, fill #3
  Filled 2022-10-12: qty 90, 90d supply, fill #4

## 2021-10-23 ENCOUNTER — Other Ambulatory Visit (HOSPITAL_COMMUNITY): Payer: Self-pay

## 2021-11-02 ENCOUNTER — Telehealth (HOSPITAL_BASED_OUTPATIENT_CLINIC_OR_DEPARTMENT_OTHER): Payer: No Typology Code available for payment source | Admitting: Psychiatry

## 2021-11-02 ENCOUNTER — Other Ambulatory Visit (HOSPITAL_COMMUNITY): Payer: Self-pay

## 2021-11-02 ENCOUNTER — Encounter (HOSPITAL_COMMUNITY): Payer: Self-pay | Admitting: Psychiatry

## 2021-11-02 VITALS — Wt 114.0 lb

## 2021-11-02 DIAGNOSIS — F419 Anxiety disorder, unspecified: Secondary | ICD-10-CM | POA: Diagnosis not present

## 2021-11-02 DIAGNOSIS — F330 Major depressive disorder, recurrent, mild: Secondary | ICD-10-CM

## 2021-11-02 MED ORDER — DULOXETINE HCL 20 MG PO CPEP
20.0000 mg | ORAL_CAPSULE | Freq: Every day | ORAL | 1 refills | Status: DC
  Filled 2021-11-02: qty 90, 90d supply, fill #0
  Filled 2022-01-28: qty 90, 90d supply, fill #1
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
## 2021-11-16 ENCOUNTER — Other Ambulatory Visit: Payer: Self-pay

## 2021-11-16 ENCOUNTER — Encounter (HOSPITAL_COMMUNITY): Payer: Self-pay

## 2021-11-16 ENCOUNTER — Emergency Department (HOSPITAL_COMMUNITY): Payer: No Typology Code available for payment source

## 2021-11-16 ENCOUNTER — Emergency Department (HOSPITAL_COMMUNITY)
Admission: EM | Admit: 2021-11-16 | Discharge: 2021-11-17 | Disposition: A | Discharge status: Home / Self Care | Payer: No Typology Code available for payment source | Attending: Emergency Medicine | Admitting: Emergency Medicine

## 2021-11-16 DIAGNOSIS — S62522A Displaced fracture of distal phalanx of left thumb, initial encounter for closed fracture: Secondary | ICD-10-CM | POA: Diagnosis not present

## 2021-11-16 DIAGNOSIS — W540XXA Bitten by dog, initial encounter: Secondary | ICD-10-CM | POA: Diagnosis not present

## 2021-11-16 DIAGNOSIS — Z23 Encounter for immunization: Secondary | ICD-10-CM | POA: Diagnosis not present

## 2021-11-16 DIAGNOSIS — S62522B Displaced fracture of distal phalanx of left thumb, initial encounter for open fracture: Secondary | ICD-10-CM

## 2021-11-16 DIAGNOSIS — S6992XA Unspecified injury of left wrist, hand and finger(s), initial encounter: Secondary | ICD-10-CM | POA: Diagnosis present

## 2021-11-16 DIAGNOSIS — T148XXA Other injury of unspecified body region, initial encounter: Secondary | ICD-10-CM

## 2021-11-16 MED ORDER — TETANUS-DIPHTH-ACELL PERTUSSIS 5-2.5-18.5 LF-MCG/0.5 IM SUSY
0.5000 mL | PREFILLED_SYRINGE | Freq: Once | INTRAMUSCULAR | Status: AC
Start: 2021-11-16 — End: 2021-11-16
  Administered 2021-11-16: 0.5 mL via INTRAMUSCULAR
  Filled 2021-11-16: qty 0.5

## 2021-11-16 MED ORDER — LIDOCAINE HCL (PF) 1 % IJ SOLN
30.0000 mL | Freq: Once | INTRAMUSCULAR | Status: AC
  Administered 2021-11-17: 30 mL
  Filled 2021-11-16: qty 30

## 2021-11-16 NOTE — ED Triage Notes (Signed)
Her dogs were fighting and she tried to get in between and they are [place] and boxer.  Bite occurred to  left hand.  Some swelling and bruising to  left foot. UTD on vaccines

## 2021-11-16 NOTE — ED Provider Triage Note (Signed)
Emergency Medicine Provider Triage Evaluation Note  Donna Phillips , a 51 y.o. female  was evaluated in triage.  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 They are up-to-date on vaccines.  Patient reports that she has not had a tetanus shot for around 10 to 13 years.  She has also small injury to the left foot but reports it is not very tender, and she is walking on it without difficulty.  Review of Systems  Positive: Thumb injury Negative: Other injuries  Physical Exam  BP (!) 149/98   Pulse 98   Temp 98.5 F (36.9 C) (Oral)   Resp 18   Ht '5\' 3"'$  (1.6 m)   Wt 51.7 kg   SpO2 97%   BMI 20.19 kg/m  Gen:   Awake, no distress   Resp:  Normal effort  MSK:   Moves extremities without difficulty  Other:  She has a large circumferential laceration of left thumb, she has intact flexion, extension of the affected extremity.  She does have some very light bruising to left foot as well, but no tenderness to palpation.  We performed shared decision making and will refrain from advanced imaging at this time.  Medical Decision Making  Medically screening exam initiated at 5:00 PM.  Appropriate orders placed.  Donna Phillips was informed that the remainder of the evaluation will be completed by another provider, this initial triage assessment does not replace that evaluation, and the importance of remaining in the ED until their evaluation is complete.  Laceration requiring repair   Anselmo Pickler, PA-C 11/16/21 1703

## 2021-11-17 ENCOUNTER — Ambulatory Visit (INDEPENDENT_AMBULATORY_CARE_PROVIDER_SITE_OTHER): Payer: No Typology Code available for payment source | Admitting: Orthopedic Surgery

## 2021-11-17 ENCOUNTER — Other Ambulatory Visit: Payer: Self-pay

## 2021-11-17 ENCOUNTER — Encounter (HOSPITAL_COMMUNITY): Payer: Self-pay | Admitting: Orthopedic Surgery

## 2021-11-17 DIAGNOSIS — S62522B Displaced fracture of distal phalanx of left thumb, initial encounter for open fracture: Secondary | ICD-10-CM

## 2021-11-17 DIAGNOSIS — S62522A Displaced fracture of distal phalanx of left thumb, initial encounter for closed fracture: Secondary | ICD-10-CM | POA: Diagnosis not present

## 2021-11-17 MED ORDER — AMPICILLIN-SULBACTAM SODIUM 3 (2-1) G IJ SOLR
3.0000 g | Freq: Once | INTRAMUSCULAR | Status: AC
  Administered 2021-11-17: 3 g via INTRAVENOUS
  Filled 2021-11-17: qty 8

## 2021-11-17 MED ORDER — AMOXICILLIN-POT CLAVULANATE 875-125 MG PO TABS: 1.0000 | ORAL_TABLET | Freq: Two times a day (BID) | ORAL | 0 refills | Status: DC

## 2021-11-17 MED ORDER — HYDROCODONE-ACETAMINOPHEN 5-325 MG PO TABS
1.0000 | ORAL_TABLET | Freq: Four times a day (QID) | ORAL | 0 refills | Status: DC | PRN
Start: 2021-11-17 — End: 2021-11-18

## 2021-11-17 NOTE — H&P (View-Only) (Signed)
Office Visit Note   Patient: Donna Phillips           Date of Birth: November 10, 1970           MRN: [id number] Visit Date: 11/17/2021              Requested by: Asencion Noble, MD [address] [place],  [place] [address] PCP: Asencion Noble, MD   Assessment & Plan: Visit Diagnoses:  1. Open displaced fracture of distal phalanx of left thumb, initial encounter     Plan: Patient presents with open fracture of the left thumb distal phalanx after trying break up a fight between 2 dogs yesterday.  She was seen in the ER where she was found to have a near circumferential laceration of the left thumb with an associated fracture of the base of the distal phalanx.  The wound was thoroughly irrigated in the ER and loosely reapproximated with a few sutures.  She was given IV antibiotics in the ER and discharged on p.o. Augmentin.  The tip of the thumb is perfused but mildly congested.  She has some sensory loss distal to the laceration but can feel sharp at the radial aspect of the thumb tip.  We discussed formal irrigation and debridement with reduction and pinning of the fracture versus continued observation.  Patient and her husband would like to proceed with surgery.  We reviewed the risk of surgery including bleeding, infection, damage to neurovascular structures, nonunion, malunion, cyst persistent pain, persistent numbness, damage to the vascular supply of the thumb.  We will try to do this tomorrow and she will be added to the schedule accordingly.  Follow-Up Instructions: No follow-ups on file.   Orders:  No orders of the defined types were placed in this encounter.  No orders of the defined types were placed in this encounter.     Procedures: No procedures performed   Clinical Data: No additional findings.   Subjective: Chief Complaint  Patient presents with   Left Thumb - Wound Check    Laceration from dog  bite     This is a 51 year old right-hand-dominant female presents for  ER follow-up of a left thumb dog bite injury.  Patient was breaking up a fight between 2 dogs yesterday when her thumb was bitten.  She was seen in the ER yesterday evening and was found to have a near circumferential laceration of the left thumb just distal to the DIP joint.  The wound was thoroughly irrigated in the ER and loosely reapproximated with a few nylon sutures.  She was treated with IV antibiotics last night and was discharged on p.o. Augmentin.  She does describe pain in the tip of the thumb at 6/10 at worst.  She does have some numbness and paresthesias distal to the laceration.  Wound Check    Review of Systems   Objective: Vital Signs: There were no vitals taken for this visit.  Physical Exam Constitutional:      Appearance: Normal appearance.  Cardiovascular:     Rate and Rhythm: Normal rate.     Pulses: Normal pulses.  Pulmonary:     Effort: Pulmonary effort is normal.  Skin:    General: Skin is warm.     Capillary Refill: Capillary refill takes less than 2 seconds.  Neurological:     Mental Status: She is alert.     Left Hand Exam   Tenderness  Left hand tenderness location: TTP at tip of thumb around laceration.  Other  Erythema: absent Sensation: decreased Pulse: present  Comments:  Near circumferential laceration of tip of thumb distal to DIP flexion crease.  A few sutures in place.  Brisk capillary refill in tip of thumb but with appearance of hyperemia.  FPL seems to be intact.  No damage to nail bed.       Specialty Comments:  No specialty comments available.  Imaging: DG Hand Complete Left  Result Date: 11/16/2021 CLINICAL DATA:  Dog bite EXAM: LEFT HAND - COMPLETE 3 VIEW COMPARISON:  None Available. FINDINGS: Comminuted and displaced fracture of the distal phalanx of the thumb. Evidence of dislocation. No evidence of arthropathy or other focal bone abnormality. Soft tissue swelling of the thumb. IMPRESSION: Comminuted and displaced  fracture of the distal phalanx of the thumb. Electronically Signed   By: Yetta Glassman M.D.   On: 11/16/2021 17:43     PMFS History: Patient Active Problem List   Diagnosis Date Noted   Open displaced fracture of distal phalanx of left thumb 11/17/2021   Special screening for malignant neoplasms, colon    Adenomatous polyp of sigmoid colon    MDD (major depressive disorder), recurrent episode, mild (HCC) 06/22/2017   Ganglion cyst of dorsum of right wrist 05/11/2017   Past Medical History:  Diagnosis Date   Abnormal Pap smear    AMA (advanced maternal age) multigravida 35+    Anxiety    H/O varicella    HSV (herpes simplex virus) anogenital infection    Hypertension    PONV (postoperative nausea and vomiting)     Family History  Problem Relation Age of Onset   Heart disease Paternal Uncle    Diabetes Maternal Grandfather    Heart disease Maternal Grandfather    Heart attack Paternal Grandmother    Heart attack Paternal Grandfather    Stroke Paternal Grandfather    Heart attack Father     Past Surgical History:  Procedure Laterality Date   CESAREAN SECTION  02/27/2012   Procedure: CESAREAN SECTION;  Surgeon: Linda Hedges, DO;  Location: [facility];  Service: Obstetrics;  Laterality: N/A;  Primary cesarean section with delivery of baby  girl at 78.  Apgars9/9.   COLONOSCOPY WITH PROPOFOL N/A 04/23/2021   Procedure: COLONOSCOPY WITH PROPOFOL;  Surgeon: Aviva Signs, MD;  Location: [facility];  Service: Gastroenterology;  Laterality: N/A;   HERNIA REPAIR  1974   LEEP     POLYPECTOMY  04/23/2021   Procedure: POLYPECTOMY;  Surgeon: Aviva Signs, MD;  Location: [facility];  Service: Gastroenterology;;   TONSILLECTOMY  1993   Social History   Occupational History   Not on file  Tobacco Use   Smoking status: Never   Smokeless tobacco: Never  Vaping Use   Vaping Use: Never used  Substance and Sexual Activity   Alcohol use: No   Drug use: No   Sexual activity: Yes     Birth control/protection: None

## 2021-11-17 NOTE — Progress Notes (Signed)
Mrs. Raisanen denies chest pain or shortness of breath.  Patient denies having any s/s of Covid in her household, also denies any known exposure to Covid.   Mrs. Mcbeth's PCP is Dr.Fagan.

## 2021-11-17 NOTE — ED Notes (Addendum)
Ortho called for thumb spica placement

## 2021-11-17 NOTE — ED Notes (Signed)
Patients thumb dressed in non adhesive pad, curlex and coband. No obvious bleeding through after dressing applied.

## 2021-11-17 NOTE — ED Provider Notes (Signed)
[facility] EMERGENCY DEPARTMENT Provider Note   CSN: [id number] Arrival date & time: 11/16/21  1623     History  Chief Complaint  Patient presents with   Animal Bite    Donna Phillips is a 51 y.o. female.   Animal Bite Patient presents with animal bite to her left thumb.  She was breaking up a fight between her 2 dogs when she was bitten in the left thumb.  Both dogs are fully vaccinated He sustained a large laceration to the thumb.  No other injuries are reported.  She reports her thumb is numb, but she still has sensation intact She is not on anticoagulation     Home Medications Prior to Admission medications   Medication Sig Start Date End Date Taking? Authorizing Provider  amoxicillin-clavulanate (AUGMENTIN) 875-125 MG tablet Take 1 tablet by mouth every 12 (twelve) hours. 11/17/21  Yes Ripley Fraise, MD  HYDROcodone-acetaminophen (NORCO/VICODIN) 5-325 MG tablet Take 1 tablet by mouth every 6 (six) hours as needed for severe pain. 11/17/21  Yes Ripley Fraise, MD  DULoxetine (CYMBALTA) 20 MG capsule Take 1 capsule (20 mg total) by mouth daily. 11/02/21   Arfeen, Arlyce Harman, MD  Multiple Vitamins-Minerals (MULTIVITAMIN ADULT PO) Take 1 tablet by mouth daily.    [provider]  norethindrone-ethinyl estradiol-iron (LARIN FE 1.5/30) 1.5-30 MG-MCG tablet Take 1 tablet by mouth daily. 08/03/21     verapamil (VERELAN PM) 120 MG 24 hr capsule Take 1 capsule (120 mg total) by mouth daily. 10/19/21         Allergies    Codeine and Penicillins    Review of Systems   Review of Systems  Skin:  Positive for wound.    Physical Exam Updated Vital Signs BP (!) 152/97   Pulse 91   Temp 98.5 F (36.9 C)   Resp 15   Ht 1.6 m ('5\' 3"'$ )   Wt 51.7 kg   SpO2 100%   BMI 20.19 kg/m  Physical Exam CONSTITUTIONAL: Well developed/well nourished HEAD: Normocephalic/atraumatic EYES: EOMI NEURO: Pt is awake/alert/appropriate, moves all extremitiesx4.  No facial  droop.  She reports the distal tip of her left thumb is numb, but still has sensation EXTREMITIES: Large laceration noted to the left thumb, bleeding noted. SKIN: See photo PSYCH: no abnormalities of mood noted, alert and oriented to situation      ED Results / Procedures / Treatments   Labs (all labs ordered are listed, but only abnormal results are displayed) Labs Reviewed - No data to display  EKG None  Radiology DG Hand Complete Left  Result Date: 11/16/2021 CLINICAL DATA:  Dog bite EXAM: LEFT HAND - COMPLETE 3 VIEW COMPARISON:  None Available. FINDINGS: Comminuted and displaced fracture of the distal phalanx of the thumb. Evidence of dislocation. No evidence of arthropathy or other focal bone abnormality. Soft tissue swelling of the thumb. IMPRESSION: Comminuted and displaced fracture of the distal phalanx of the thumb. Electronically Signed   By: Yetta Glassman M.D.   On: 11/16/2021 17:43    Procedures .Splint Application  Date/Time: 11/17/2021 5:21 AM  Performed by: Ripley Fraise, MD Authorized by: Ripley Fraise, MD   Consent:    Consent obtained:  Verbal   Consent given by:  Patient Pre-procedure details:    Distal neurologic exam:  Normal Procedure details:    Location:  Hand   Hand location:  L hand   Splint type:  Thumb spica Post-procedure details:    Procedure completion:  Tolerated     Medications Ordered in ED Medications  Tdap (BOOSTRIX) injection 0.5 mL (0.5 mLs Intramuscular Given 11/16/21 1717)  lidocaine (PF) (XYLOCAINE) 1 % injection 30 mL (30 mLs Infiltration Given 11/17/21 0403)  Ampicillin-Sulbactam (UNASYN) 3 g in sodium chloride 0.9 % 100 mL IVPB (0 g Intravenous Stopped 11/17/21 0339)    ED Course/ Medical Decision Making/ A&P Clinical Course as of 11/17/21 0521  Tue Nov 17, 2021  0205 Discussed with Dr. Tempie Donning with hand surgery.  He had used the photos.  As long as the tip of the thumb is well-perfused, he request the following:  Clean out the wound extensively.  Very loose closure with couple sutures.  Give IV dose of Unasyn at discharge Augmentin.  Thumb spica, call office later today [DW]  0244 X-rays been personally visualized which reveals comminuted fracture of the distal phalanx. [DW]  8527 Patient improved.  Wound has been repaired by PA Petrucelli.  Tolerated well.  Splint has been applied.  She will call the hand specialist later today.  Prescriptions have been sent to her pharmacy [DW]    Clinical Course User Index [DW] Ripley Fraise, MD                           Medical Decision Making Risk Prescription drug management.           Final Clinical Impression(s) / ED Diagnoses Final diagnoses:  Animal bite  Dog bite, initial encounter  Open displaced fracture of distal phalanx of left thumb, initial encounter    Rx / DC Orders ED Discharge Orders          Ordered    HYDROcodone-acetaminophen (NORCO/VICODIN) 5-325 MG tablet  Every 6 hours PRN        11/17/21 0520    amoxicillin-clavulanate (AUGMENTIN) 875-125 MG tablet  Every 12 hours        11/17/21 0520              Ripley Fraise, MD 11/17/21 0522

## 2021-11-17 NOTE — ED Provider Notes (Signed)
  Please see Dr. Raechel Chute note for full H&P.   Procedures  .Marland KitchenLaceration Repair  Date/Time: 11/17/2021 3:58 AM  Performed by: Amaryllis Dyke, PA-C Authorized by: Amaryllis Dyke, PA-C   Consent:    Consent obtained:  Verbal   Consent given by:  Patient   Risks, benefits, and alternatives were discussed: yes     Risks discussed:  Infection, need for additional repair, nerve damage, poor cosmetic result, pain, retained foreign body, tendon damage, vascular damage and poor wound healing   Alternatives discussed:  No treatment Anesthesia:    Anesthesia method:  Nerve block   Block location:  Left 1st finger   Block needle gauge:  27 G   Block anesthetic:  Lidocaine 1% w/o epi   Block injection procedure:  Anatomic landmarks identified, anatomic landmarks palpated, negative aspiration for blood, introduced needle and incremental injection   Block outcome:  Anesthesia achieved Laceration details:    Location:  Finger   Finger location:  L thumb   Length (cm):  5   Depth (mm):  4 Pre-procedure details:    Preparation:  Patient was prepped and draped in usual sterile fashion and imaging obtained to evaluate for foreign bodies Treatment:    Area cleansed with:  Povidone-iodine   Amount of cleaning:  Standard   Irrigation solution:  Sterile water   Irrigation volume:  2L   Irrigation method:  Pressure wash Skin repair:    Repair method:  Sutures   Suture size:  4-0   Suture material:  Nylon   Suture technique:  Simple interrupted   Number of sutures:  9 Approximation:    Approximation:  Loose Post-procedure details:    Procedure completion:  Tolerated well, no immediate complications    Amaryllis Dyke, PA-C 11/17/21 [place], Donald, MD 11/17/21 [phone]

## 2021-11-17 NOTE — Progress Notes (Signed)
Office Visit Note   Patient: Donna Phillips           Date of Birth: 04/02/1970           MRN: [id number] Visit Date: 11/17/2021              Requested by: Asencion Noble, MD [address] [place],   [address] PCP: Asencion Noble, MD   Assessment & Plan: Visit Diagnoses:  1. Open displaced fracture of distal phalanx of left thumb, initial encounter     Plan: Patient presents with open fracture of the left thumb distal phalanx after trying break up a fight between 2 dogs yesterday.  She was seen in the ER where she was found to have a near circumferential laceration of the left thumb with an associated fracture of the base of the distal phalanx.  The wound was thoroughly irrigated in the ER and loosely reapproximated with a few sutures.  She was given IV antibiotics in the ER and discharged on p.o. Augmentin.  The tip of the thumb is perfused but mildly congested.  She has some sensory loss distal to the laceration but can feel sharp at the radial aspect of the thumb tip.  We discussed formal irrigation and debridement with reduction and pinning of the fracture versus continued observation.  Patient and her husband would like to proceed with surgery.  We reviewed the risk of surgery including bleeding, infection, damage to neurovascular structures, nonunion, malunion, cyst persistent pain, persistent numbness, damage to the vascular supply of the thumb.  We will try to do this tomorrow and she will be added to the schedule accordingly.  Follow-Up Instructions: No follow-ups on file.   Orders:  No orders of the defined types were placed in this encounter.  No orders of the defined types were placed in this encounter.     Procedures: No procedures performed   Clinical Data: No additional findings.   Subjective: Chief Complaint  Patient presents with   Left Thumb - Wound Check    Laceration from dog  bite     This is a 51 year old right-hand-dominant female presents for  ER follow-up of a left thumb dog bite injury.  Patient was breaking up a fight between 2 dogs yesterday when her thumb was bitten.  She was seen in the ER yesterday evening and was found to have a near circumferential laceration of the left thumb just distal to the DIP joint.  The wound was thoroughly irrigated in the ER and loosely reapproximated with a few nylon sutures.  She was treated with IV antibiotics last night and was discharged on p.o. Augmentin.  She does describe pain in the tip of the thumb at 6/10 at worst.  She does have some numbness and paresthesias distal to the laceration.  Wound Check    Review of Systems   Objective: Vital Signs: There were no vitals taken for this visit.  Physical Exam Constitutional:      Appearance: Normal appearance.  Cardiovascular:     Rate and Rhythm: Normal rate.     Pulses: Normal pulses.  Pulmonary:     Effort: Pulmonary effort is normal.  Skin:    General: Skin is warm.     Capillary Refill: Capillary refill takes less than 2 seconds.  Neurological:     Mental Status: She is alert.     Left Hand Exam   Tenderness  Left hand tenderness location: TTP at tip of thumb around laceration.  Other  Erythema: absent Sensation: decreased Pulse: present  Comments:  Near circumferential laceration of tip of thumb distal to DIP flexion crease.  A few sutures in place.  Brisk capillary refill in tip of thumb but with appearance of hyperemia.  FPL seems to be intact.  No damage to nail bed.       Specialty Comments:  No specialty comments available.  Imaging: DG Hand Complete Left  Result Date: 11/16/2021 CLINICAL DATA:  Dog bite EXAM: LEFT HAND - COMPLETE 3 VIEW COMPARISON:  None Available. FINDINGS: Comminuted and displaced fracture of the distal phalanx of the thumb. Evidence of dislocation. No evidence of arthropathy or other focal bone abnormality. Soft tissue swelling of the thumb. IMPRESSION: Comminuted and displaced  fracture of the distal phalanx of the thumb. Electronically Signed   By: Yetta Glassman M.D.   On: 11/16/2021 17:43     PMFS History: Patient Active Problem List   Diagnosis Date Noted   Open displaced fracture of distal phalanx of left thumb 11/17/2021   Special screening for malignant neoplasms, colon    Adenomatous polyp of sigmoid colon    MDD (major depressive disorder), recurrent episode, mild (HCC) 06/22/2017   Ganglion cyst of dorsum of right wrist 05/11/2017   Past Medical History:  Diagnosis Date   Abnormal Pap smear    AMA (advanced maternal age) multigravida 35+    Anxiety    H/O varicella    HSV (herpes simplex virus) anogenital infection    Hypertension    PONV (postoperative nausea and vomiting)     Family History  Problem Relation Age of Onset   Heart disease Paternal Uncle    Diabetes Maternal Grandfather    Heart disease Maternal Grandfather    Heart attack Paternal Grandmother    Heart attack Paternal Grandfather    Stroke Paternal Grandfather    Heart attack Father     Past Surgical History:  Procedure Laterality Date   CESAREAN SECTION  02/27/2012   Procedure: CESAREAN SECTION;  Surgeon: Linda Hedges, DO;  Location: [facility];  Service: Obstetrics;  Laterality: N/A;  Primary cesarean section with delivery of baby  girl at 6.  Apgars9/9.   COLONOSCOPY WITH PROPOFOL N/A 04/23/2021   Procedure: COLONOSCOPY WITH PROPOFOL;  Surgeon: Aviva Signs, MD;  Location: [facility];  Service: Gastroenterology;  Laterality: N/A;   HERNIA REPAIR  1974   LEEP     POLYPECTOMY  04/23/2021   Procedure: POLYPECTOMY;  Surgeon: Aviva Signs, MD;  Location: [facility];  Service: Gastroenterology;;   TONSILLECTOMY  1993   Social History   Occupational History   Not on file  Tobacco Use   Smoking status: Never   Smokeless tobacco: Never  Vaping Use   Vaping Use: Never used  Substance and Sexual Activity   Alcohol use: No   Drug use: No   Sexual activity: Yes     Birth control/protection: None

## 2021-11-17 NOTE — Progress Notes (Signed)
Orthopedic Tech Progress Note Patient Details:  Donna Phillips 01/25/1971 [id number]  Ortho Devices Type of Ortho Device: Thumb spica splint Splint Material: Fiberglass Ortho Device/Splint Location: lue Ortho Device/Splint Interventions: Ordered, Application, Adjustment   Post Interventions Patient Tolerated: Well Instructions Provided: Care of device, Adjustment of device  Karolee Stamps 11/17/2021, 5:23 AM

## 2021-11-18 ENCOUNTER — Encounter (HOSPITAL_COMMUNITY)
Admission: RE | Disposition: A | Discharge status: Home / Self Care | Payer: Self-pay | Source: Ambulatory Visit | Attending: Orthopedic Surgery

## 2021-11-18 ENCOUNTER — Other Ambulatory Visit: Payer: Self-pay

## 2021-11-18 ENCOUNTER — Ambulatory Visit (HOSPITAL_COMMUNITY): Payer: No Typology Code available for payment source

## 2021-11-18 ENCOUNTER — Encounter (HOSPITAL_COMMUNITY): Payer: Self-pay | Admitting: Orthopedic Surgery

## 2021-11-18 ENCOUNTER — Ambulatory Visit (HOSPITAL_COMMUNITY)
Admission: RE | Admit: 2021-11-18 | Discharge: 2021-11-18 | Disposition: A | Discharge status: Home / Self Care | Payer: No Typology Code available for payment source | Source: Ambulatory Visit | Attending: Orthopedic Surgery | Admitting: Orthopedic Surgery

## 2021-11-18 ENCOUNTER — Ambulatory Visit (HOSPITAL_COMMUNITY): Payer: No Typology Code available for payment source | Admitting: Anesthesiology

## 2021-11-18 ENCOUNTER — Ambulatory Visit (HOSPITAL_BASED_OUTPATIENT_CLINIC_OR_DEPARTMENT_OTHER): Payer: No Typology Code available for payment source | Admitting: Anesthesiology

## 2021-11-18 DIAGNOSIS — S55012A Laceration of ulnar artery at forearm level, left arm, initial encounter: Secondary | ICD-10-CM

## 2021-11-18 DIAGNOSIS — S62522B Displaced fracture of distal phalanx of left thumb, initial encounter for open fracture: Secondary | ICD-10-CM

## 2021-11-18 DIAGNOSIS — S61052A Open bite of left thumb without damage to nail, initial encounter: Secondary | ICD-10-CM | POA: Insufficient documentation

## 2021-11-18 DIAGNOSIS — I10 Essential (primary) hypertension: Secondary | ICD-10-CM | POA: Insufficient documentation

## 2021-11-18 DIAGNOSIS — W540XXA Bitten by dog, initial encounter: Secondary | ICD-10-CM | POA: Insufficient documentation

## 2021-11-18 DIAGNOSIS — S6402XA Injury of ulnar nerve at wrist and hand level of left arm, initial encounter: Secondary | ICD-10-CM

## 2021-11-18 HISTORY — PX: CLOSED REDUCTION FINGER WITH PERCUTANEOUS PINNING: SHX5612

## 2021-11-18 HISTORY — DX: Depression, unspecified: F32.A

## 2021-11-18 HISTORY — DX: Headache, unspecified: R51.9

## 2021-11-18 HISTORY — PX: I & D EXTREMITY: SHX5045

## 2021-11-18 LAB — BASIC METABOLIC PANEL
Anion gap: 9 (ref 5–15)
BUN: 7 mg/dL (ref 6–20)
CO2: 21 mmol/L — ABNORMAL LOW (ref 22–32)
Calcium: 8.6 mg/dL — ABNORMAL LOW (ref 8.9–10.3)
Chloride: 106 mmol/L (ref 98–111)
Creatinine, Ser: 0.81 mg/dL (ref 0.44–1.00)
GFR, Estimated: >60 mL/min (ref >60)
Glucose, Bld: 96 mg/dL (ref 70–99)
Potassium: 3.1 mmol/L — ABNORMAL LOW (ref 3.5–5.1)
Sodium: 136 mmol/L (ref 135–145)

## 2021-11-18 LAB — POCT PREGNANCY, URINE: Preg Test, Ur: NEGATIVE

## 2021-11-18 SURGERY — IRRIGATION AND DEBRIDEMENT EXTREMITY
Anesthesia: General | Site: Thumb | Laterality: Left

## 2021-11-18 MED ORDER — LIDOCAINE 2% (20 MG/ML) 5 ML SYRINGE
INTRAMUSCULAR | Status: AC
  Filled 2021-11-18: qty 5

## 2021-11-18 MED ORDER — CELECOXIB 200 MG PO CAPS
ORAL_CAPSULE | ORAL | Status: AC
  Filled 2021-11-18: qty 1

## 2021-11-18 MED ORDER — LACTATED RINGERS IV SOLN: INTRAVENOUS | Status: DC

## 2021-11-18 MED ORDER — BUPIVACAINE HCL (PF) 0.25 % IJ SOLN
INTRAMUSCULAR | Status: DC | PRN
  Administered 2021-11-18: 5 mL

## 2021-11-18 MED ORDER — ONDANSETRON HCL 4 MG/2ML IJ SOLN
INTRAMUSCULAR | Status: AC
Start: 2021-11-18 — End: ?
  Filled 2021-11-18: qty 2

## 2021-11-18 MED ORDER — PROMETHAZINE HCL 25 MG/ML IJ SOLN: 6.2500 mg | INTRAMUSCULAR | Status: DC | PRN

## 2021-11-18 MED ORDER — OXYCODONE HCL 5 MG PO TABS: 5.0000 mg | ORAL_TABLET | ORAL | 0 refills | Status: DC | PRN

## 2021-11-18 MED ORDER — FENTANYL CITRATE (PF) 250 MCG/5ML IJ SOLN
INTRAMUSCULAR | Status: DC | PRN
  Administered 2021-11-18: 100 ug via INTRAVENOUS
  Administered 2021-11-18 (×3): 50 ug via INTRAVENOUS

## 2021-11-18 MED ORDER — 0.9 % SODIUM CHLORIDE (POUR BTL) OPTIME
TOPICAL | Status: DC | PRN
  Administered 2021-11-18: 2000 mL

## 2021-11-18 MED ORDER — CELECOXIB 200 MG PO CAPS
200.0000 mg | ORAL_CAPSULE | Freq: Once | ORAL | Status: AC
  Administered 2021-11-18: 200 mg via ORAL

## 2021-11-18 MED ORDER — BUPIVACAINE HCL (PF) 0.25 % IJ SOLN
INTRAMUSCULAR | Status: AC
  Filled 2021-11-18: qty 30

## 2021-11-18 MED ORDER — DEXAMETHASONE SODIUM PHOSPHATE 10 MG/ML IJ SOLN
INTRAMUSCULAR | Status: AC
  Filled 2021-11-18: qty 1

## 2021-11-18 MED ORDER — PROPOFOL 500 MG/50ML IV EMUL
INTRAVENOUS | Status: DC | PRN
  Administered 2021-11-18: 50 ug/kg/min via INTRAVENOUS

## 2021-11-18 MED ORDER — FENTANYL CITRATE (PF) 100 MCG/2ML IJ SOLN: 25.0000 ug | INTRAMUSCULAR | Status: DC | PRN

## 2021-11-18 MED ORDER — ONDANSETRON HCL 4 MG/2ML IJ SOLN
INTRAMUSCULAR | Status: DC | PRN
  Administered 2021-11-18: 4 mg via INTRAVENOUS

## 2021-11-18 MED ORDER — BACITRACIN ZINC 500 UNIT/GM EX OINT
TOPICAL_OINTMENT | CUTANEOUS | Status: AC
  Filled 2021-11-18: qty 28.35

## 2021-11-18 MED ORDER — PROPOFOL 10 MG/ML IV BOLUS
INTRAVENOUS | Status: DC | PRN
  Administered 2021-11-18: 160 mg via INTRAVENOUS
  Administered 2021-11-18: 40 mg via INTRAVENOUS

## 2021-11-18 MED ORDER — MEPERIDINE HCL 25 MG/ML IJ SOLN: 6.2500 mg | INTRAMUSCULAR | Status: DC | PRN

## 2021-11-18 MED ORDER — ACETAMINOPHEN 500 MG PO TABS
1000.0000 mg | ORAL_TABLET | Freq: Once | ORAL | Status: AC
  Administered 2021-11-18: 1000 mg via ORAL

## 2021-11-18 MED ORDER — CHLORHEXIDINE GLUCONATE 0.12 % MT SOLN
15.0000 mL | Freq: Once | OROMUCOSAL | Status: AC
  Administered 2021-11-18: 15 mL via OROMUCOSAL
  Filled 2021-11-18: qty 15

## 2021-11-18 MED ORDER — MIDAZOLAM HCL 2 MG/2ML IJ SOLN
INTRAMUSCULAR | Status: AC
  Filled 2021-11-18: qty 2

## 2021-11-18 MED ORDER — VANCOMYCIN HCL IN DEXTROSE 1-5 GM/200ML-% IV SOLN
1000.0000 mg | INTRAVENOUS | Status: AC
  Administered 2021-11-18: 1000 mg via INTRAVENOUS
  Filled 2021-11-18: qty 200

## 2021-11-18 MED ORDER — DEXAMETHASONE SODIUM PHOSPHATE 10 MG/ML IJ SOLN
INTRAMUSCULAR | Status: DC | PRN
  Administered 2021-11-18: 10 mg via INTRAVENOUS

## 2021-11-18 MED ORDER — SUCCINYLCHOLINE CHLORIDE 200 MG/10ML IV SOSY
PREFILLED_SYRINGE | INTRAVENOUS | Status: AC
  Filled 2021-11-18: qty 10

## 2021-11-18 MED ORDER — ACETAMINOPHEN 500 MG PO TABS
ORAL_TABLET | ORAL | Status: AC
  Filled 2021-11-18: qty 2

## 2021-11-18 MED ORDER — ORAL CARE MOUTH RINSE: 15.0000 mL | Freq: Once | OROMUCOSAL | Status: AC

## 2021-11-18 MED ORDER — MIDAZOLAM HCL 2 MG/2ML IJ SOLN
INTRAMUSCULAR | Status: DC | PRN
  Administered 2021-11-18: 2 mg via INTRAVENOUS

## 2021-11-18 MED ORDER — FENTANYL CITRATE (PF) 250 MCG/5ML IJ SOLN
INTRAMUSCULAR | Status: AC
  Filled 2021-11-18: qty 5

## 2021-11-18 MED ORDER — CEFAZOLIN SODIUM-DEXTROSE 2-4 GM/100ML-% IV SOLN: 2.0000 g | INTRAVENOUS | Status: DC

## 2021-11-18 MED ORDER — LIDOCAINE HCL (CARDIAC) PF 100 MG/5ML IV SOSY
PREFILLED_SYRINGE | INTRAVENOUS | Status: DC | PRN
  Administered 2021-11-18: 100 mg via INTRAVENOUS

## 2021-11-18 SURGICAL SUPPLY — 60 items
BAG COUNTER SPONGE SURGICOUNT (BAG) ×1 IMPLANT
BANDAGE ACE 4 STERILE (GAUZE/BANDAGES/DRESSINGS) IMPLANT
BNDG CONFORM 2 STRL LF (GAUZE/BANDAGES/DRESSINGS) IMPLANT
BNDG ELASTIC 2X5.8 VLCR STR LF (GAUZE/BANDAGES/DRESSINGS) ×1 IMPLANT
BNDG ELASTIC 3X5.8 VLCR STR LF (GAUZE/BANDAGES/DRESSINGS) ×1 IMPLANT
BNDG ELASTIC 4X5.8 VLCR STR LF (GAUZE/BANDAGES/DRESSINGS) ×1 IMPLANT
BNDG ESMARK 4X9 LF (GAUZE/BANDAGES/DRESSINGS) IMPLANT
BNDG GAUZE ELAST 4 BULKY (GAUZE/BANDAGES/DRESSINGS) ×2 IMPLANT
CAST PADDING STERILE 4X4 (CAST SUPPLIES) IMPLANT
CHLORAPREP W/TINT 26 (MISCELLANEOUS) ×1 IMPLANT
CORD BIPOLAR FORCEPS 12FT (ELECTRODE) ×1 IMPLANT
COVER MAYO STAND STRL (DRAPES) ×1 IMPLANT
COVER SURGICAL LIGHT HANDLE (MISCELLANEOUS) ×1 IMPLANT
CUFF TOURN SGL QUICK 18X4 (TOURNIQUET CUFF) ×1 IMPLANT
DRAIN PENROSE 18X1/4 LTX STRL (DRAIN) IMPLANT
DRAPE HALF SHEET 40X57 (DRAPES) ×1 IMPLANT
DRAPE OEC MINIVIEW 54X84 (DRAPES) IMPLANT
DRAPE SURG 17X23 STRL (DRAPES) ×1 IMPLANT
DRSG ADAPTIC 3X8 NADH LF (GAUZE/BANDAGES/DRESSINGS) ×1 IMPLANT
DRSG XEROFORM 1X8 (GAUZE/BANDAGES/DRESSINGS) IMPLANT
GAUZE SPONGE 4X4 12PLY STRL (GAUZE/BANDAGES/DRESSINGS) ×1 IMPLANT
GAUZE XEROFORM 1X8 LF (GAUZE/BANDAGES/DRESSINGS) ×1 IMPLANT
GLOVE BIO SURGEON STRL SZ7 (GLOVE) ×1 IMPLANT
GLOVE SURG UNDER POLY LF SZ7 (GLOVE) ×1 IMPLANT
GOWN STRL REUS W/ TWL XL LVL3 (GOWN DISPOSABLE) ×2 IMPLANT
GOWN STRL REUS W/TWL XL LVL3 (GOWN DISPOSABLE) ×2
K-WIRE DBL POINT .045X9 (Miscellaneous) ×1 IMPLANT
KIT BASIN OR (CUSTOM PROCEDURE TRAY) ×1 IMPLANT
KIT TURNOVER KIT B (KITS) ×1 IMPLANT
KWIRE DBL POINT .045X9 (Miscellaneous) IMPLANT
LOOP VESSEL MAXI BLUE (MISCELLANEOUS) IMPLANT
NDL HYPO 25GX1X1/2 BEV (NEEDLE) IMPLANT
NDL HYPO 25X1 1.5 SAFETY (NEEDLE) ×1 IMPLANT
NDL KEITH (NEEDLE) IMPLANT
NEEDLE HYPO 25GX1X1/2 BEV (NEEDLE) IMPLANT
NEEDLE HYPO 25X1 1.5 SAFETY (NEEDLE) ×1 IMPLANT
NEEDLE KEITH (NEEDLE) IMPLANT
NS IRRIG 1000ML POUR BTL (IV SOLUTION) ×1 IMPLANT
PACK ORTHO EXTREMITY (CUSTOM PROCEDURE TRAY) ×1 IMPLANT
PAD ABD 8X10 STRL (GAUZE/BANDAGES/DRESSINGS) ×1 IMPLANT
PAD ARMBOARD 7.5X6 YLW CONV (MISCELLANEOUS) ×1 IMPLANT
PAD CAST 4YDX4 CTTN HI CHSV (CAST SUPPLIES) ×2 IMPLANT
PADDING CAST ABS COTTON 3X4 (CAST SUPPLIES) ×1 IMPLANT
PADDING CAST COTTON 4X4 STRL (CAST SUPPLIES) ×2
SPLINT FIBERGLASS 3X35 (CAST SUPPLIES) IMPLANT
SPLINT PLASTER CAST XFAST 3X15 (CAST SUPPLIES) ×1 IMPLANT
SPLINT PLASTER XTRA FASTSET 3X (CAST SUPPLIES)
SPONGE T-LAP 4X18 ~~LOC~~+RFID (SPONGE) ×1 IMPLANT
SUT FIBERWIRE 3-0 18 TAPR NDL (SUTURE)
SUT VICRYL RAPIDE 4/0 PS 2 (SUTURE) IMPLANT
SUTURE FIBERWR 3-0 18 TAPR NDL (SUTURE) IMPLANT
SWAB CULTURE ESWAB REG 1ML (MISCELLANEOUS) IMPLANT
SYR BULB EAR ULCER 3OZ GRN STR (SYRINGE) ×1 IMPLANT
SYR CONTROL 10ML LL (SYRINGE) IMPLANT
TOWEL GREEN STERILE (TOWEL DISPOSABLE) ×1 IMPLANT
TOWEL GREEN STERILE FF (TOWEL DISPOSABLE) ×2 IMPLANT
TUBE CONNECTING 12X1/4 (SUCTIONS) IMPLANT
UNDERPAD 30X36 HEAVY ABSORB (UNDERPADS AND DIAPERS) ×1 IMPLANT
WATER STERILE IRR 1000ML POUR (IV SOLUTION) ×1 IMPLANT
YANKAUER SUCT BULB TIP NO VENT (SUCTIONS) IMPLANT

## 2021-11-18 NOTE — Transfer of Care (Signed)
Immediate Anesthesia Transfer of Care Note  Patient: Donna Phillips  Procedure(s) Performed: LEFT THUMB IRRIGATION AND DEBRIDEMENT EXTREMITY (Left: Thumb) LEFT THUMB CLOSED REDUCTION WITH PERCUTANEOUS PINNING (Left: Thumb)  Patient Location: PACU  Anesthesia Type:General  Level of Consciousness: awake, alert  and oriented  Airway & Oxygen Therapy: Patient Spontanous Breathing  Post-op Assessment: Report given to RN and Post -op Vital signs reviewed and stable  Post vital signs: Reviewed and stable  Last Vitals:  Vitals Value Taken Time  BP 142/92 11/18/21 2106  Temp    Pulse 106 11/18/21 2108  Resp 15 11/18/21 2108  SpO2 98 % 11/18/21 2108  Vitals shown include unvalidated device data.  Last Pain:  Vitals:   11/18/21 1354  TempSrc: Oral         Complications: No notable events documented.

## 2021-11-18 NOTE — Op Note (Signed)
Date of Surgery: 11/18/2021  INDICATIONS: Patient is a 51 y.o.-year-old female with a left thumb open distal phalanx fracture after trying to break up a fight between her two dogs. She was seen in the ER where the wound was thoroughly irrigated and very loosely reapproximated.  She was given IV and oral antibiotics.  She presents today for repeat I&D and percutaneous pinning.   Risks, benefits, and alternatives to surgery were again discussed with the patient in the preoperative area. The patient wishes to proceed with surgery.  Informed consent was signed after our discussion.   PREOPERATIVE DIAGNOSIS:  Open left thumb distal phalanx fracture Laceration of ulnar digital nerve Laceration of ulnar digital artery  POSTOPERATIVE DIAGNOSIS: Same.  PROCEDURE:  Irrigation and debridement of open fracture Closed reduction percutaneous pinning left thumb distal phalanx   SURGEON: Donna Phillips, M.D.  ASSIST:   ANESTHESIA:  general  IV FLUIDS AND URINE: See anesthesia.  ESTIMATED BLOOD LOSS: <5 mL.  IMPLANTS:  Implant Name Type Inv. Item Serial No. Manufacturer Lot No. LRB No. Used Action  K-WIRE DBL POINT .[id number] - [id number] Miscellaneous K-WIRE DBL POINT .[id number]  [employer](ORTH,TRAU,BIO,SG) [id number] Left 1 Implanted     DRAINS: None  COMPLICATIONS: None  DESCRIPTION OF PROCEDURE: The patient was met in the preoperative holding area where the surgical site was marked and the consent form was verified.  The patient was then taken to the operating room and transferred to the operating table.  All bony prominences were well padded.  A tourniquet was applied to the left upper arm.  General endotracheal anesthesia was induced.  The operative extremity was prepped and draped in the usual and sterile fashion.  A formal time-out was performed to confirm that this was the correct patient, surgery, side, and site.   Following timeout, the limb was gently exsanguinated with an Esmarch  bandage and the tourniquet inflated to 250 mmHg.  The previous nylon sutures were removed.  The wound was opened.  The radial neurovascular bundle appeared to be intact.  The ulnar vascular bundle was lacerated and was nonrepairable secondary to the mechanism of injury.  The wound was thoroughly irrigated with copious sterile saline.  There was some dog for in the wound which was debrided using pickups, rongeurs, and curettes.  The open fracture was identified.  The fracture site was thoroughly irrigated and also debrided using a curette and rondure.  The wound edges were thoroughly debrided of any devitalized or none healthy appearing tissue.  Following copious irrigation with sterile saline and thorough debridement, the distal phalanx fracture was pinned using a 0.045 K wire in a retrograde fashion.  AP and lateral fluoroscopic views demonstrated anatomic reduction of the fracture and pinning across the thumb IP joint.  The wound was again irrigated and then closed using a 4-0 Vicryl Rapide suture in a simple interrupted fashion.  The tourniquet was let not down.  The thumb was warm, pink, and well-perfused.  The K wire was cut, bent, and dressed with Xeroform.  The wound was dressed with Xeroform, bacitracin ointment, folded Kerlix, cast padding, and a well-padded thumb spica splint was applied.  The patient was then reversed of anesthesia and extubated uneventfully.  She was transferred to the postoperative bed and taken to the postoperative recovery unit in stable condition.   POSTOPERATIVE PLAN: She will be discharged home with appropriate pain medication and discharge instructions.  She is already been given a prescription for p.o. antibiotics given the nature of  the wound and mechanism of injury with dog bite.  I will see her next week for a wound check.  Donna Nine, MD 9:04 PM

## 2021-11-18 NOTE — Anesthesia Postprocedure Evaluation (Signed)
Anesthesia Post Note  Patient: Donna Phillips  Procedure(s) Performed: LEFT THUMB IRRIGATION AND DEBRIDEMENT EXTREMITY (Left: Thumb) LEFT THUMB CLOSED REDUCTION WITH PERCUTANEOUS PINNING (Left: Thumb)     Patient location during evaluation: PACU Anesthesia Type: General Level of consciousness: sedated Pain management: pain level controlled Vital Signs Assessment: post-procedure vital signs reviewed and stable Respiratory status: spontaneous breathing and respiratory function stable Cardiovascular status: stable Postop Assessment: no apparent nausea or vomiting Anesthetic complications: no   No notable events documented.  Last Vitals:  Vitals:   11/18/21 2110 11/18/21 2125  BP: (!) 142/92 (!) 137/94  Pulse: (!) 103 99  Resp: 10 16  Temp: 36.8 C   SpO2: 98% 96%    Last Pain:  Vitals:   11/18/21 2125  TempSrc:   PainSc: 0-No pain                 Yahia Bottger DANIEL

## 2021-11-18 NOTE — Anesthesia Preprocedure Evaluation (Addendum)
Anesthesia Evaluation  Patient identified by MRN, date of birth, ID band Patient awake    Reviewed: Allergy & Precautions, H&P , NPO status , Patient's Chart, lab work & pertinent test results  History of Anesthesia Complications (+) PONV and history of anesthetic complications  Airway Mallampati: II  TM Distance: >3 FB Neck ROM: full    Dental no notable dental hx. (+) Dental Advisory Given, Teeth Intact   Pulmonary neg pulmonary ROS,    Pulmonary exam normal breath sounds clear to auscultation       Cardiovascular Exercise Tolerance: Good hypertension,  Rhythm:regular Rate:Normal     Neuro/Psych  Headaches, PSYCHIATRIC DISORDERS Anxiety Depression    GI/Hepatic negative GI ROS, Neg liver ROS,   Endo/Other  negative endocrine ROS  Renal/GU negative Renal ROS     Musculoskeletal   Abdominal   Peds  Hematology negative hematology ROS (+)   Anesthesia Other Findings   Reproductive/Obstetrics negative OB ROS                            Anesthesia Physical  Anesthesia Plan  ASA: 2  Anesthesia Plan: General   Post-op Pain Management: Celebrex PO (pre-op)* and Tylenol PO (pre-op)*   Induction: Intravenous  PONV Risk Score and Plan: 4 or greater and Ondansetron, Dexamethasone, TIVA and Scopolamine patch - Pre-op  Airway Management Planned: LMA  Additional Equipment:   Intra-op Plan:   Post-operative Plan: Extubation in OR  Informed Consent: I have reviewed the patients History and Physical, chart, labs and discussed the procedure including the risks, benefits and alternatives for the proposed anesthesia with the patient or authorized representative who has indicated his/her understanding and acceptance.     Dental advisory given  Plan Discussed with: CRNA, Anesthesiologist and Surgeon  Anesthesia Plan Comments:       Anesthesia Quick Evaluation

## 2021-11-18 NOTE — Discharge Instructions (Signed)
Audria Nine, M.D. Hand Surgery  POST-OPERATIVE DISCHARGE INSTRUCTIONS   PRESCRIPTIONS: You may have been given a prescription to be taken as directed for post-operative pain control.  You may also take over the counter ibuprofen/aleve and tylenol for pain. Take this as directed on the packaging. Do not exceed 3000 mg tylenol/acetaminophen in 24 hours.  Ibuprofen 600-800 mg (3-4) tablets by mouth every 6 hours as needed for pain.  OR Aleve 2 tablets by mouth every 12 hours (twice daily) as needed for pain.  AND/OR Tylenol 1000 mg (2 tablets) every 8 hours as needed for pain.  Please use your pain medication carefully, as refills are limited and you may not be provided with one.  As stated above, please use over the counter pain medicine - it will also be helpful with decreasing your swelling.    ANESTHESIA: After your surgery, post-surgical discomfort or pain is likely. This discomfort can last several days to a few weeks. At certain times of the day your discomfort may be more intense.   Did you receive a nerve block?  A nerve block can provide pain relief for one hour to two days after your surgery. As long as the nerve block is working, you will experience little or no sensation in the area the surgeon operated on.  As the nerve block wears off, you will begin to experience pain or discomfort. It is very important that you begin taking your prescribed pain medication before the nerve block fully wears off. Treating your pain at the first sign of the block wearing off will ensure your pain is better controlled and more tolerable when full-sensation returns. Do not wait until the pain is intolerable, as the medicine will be less effective. It is better to treat pain in advance than to try and catch up.   General Anesthesia:  If you did not receive a nerve block during your surgery, you will need to start taking your pain medication shortly after your surgery and should continue  to do so as prescribed by your surgeon.     ICE AND ELEVATION: You may use ice for the first 48-72 hours, but it is not critical.   Motion of your fingers is very important to decrease the swelling.  Elevation, as much as possible for the next 48 hours, is critical for decreasing swelling as well as for pain relief. Elevation means when you are seated or lying down, you hand should be at or above your heart. When walking, the hand needs to be at or above the level of your elbow.  If the bandage gets too tight, it may need to be loosened. Please contact our office and we will instruct you in how to do this.    SURGICAL BANDAGES:  Keep your dressing and/or splint clean and dry at all times.  Do not remove until you are seen again in the office.  If careful, you may place a plastic bag over your bandage and tape the end to shower, but be careful, do not get your bandages wet.     HAND THERAPY:  You may not need any. If you do, we will begin this at your follow up visit in the clinic.    ACTIVITY AND WORK: You are encouraged to move any fingers which are not in the bandage.  Light use of the fingers is allowed to assist the other hand with daily hygiene and eating, but strong gripping or lifting is often uncomfortable and  should be avoided.  You might miss a variable period of time from work and hopefully this issue has been discussed prior to surgery. You may not do any heavy work with your affected hand for about 2 weeks.    [facility] [address] [place],  [place]  [address] [phone]

## 2021-11-18 NOTE — Interval H&P Note (Signed)
History and Physical Interval Note:  11/18/2021 6:58 PM  Donna Phillips  has presented today for surgery, with the diagnosis of LEFT THUMB FRACTURE.  The various methods of treatment have been discussed with the patient and family. After consideration of risks, benefits and other options for treatment, the patient has consented to  Procedure(s): LEFT THUMB IRRIGATION AND DEBRIDEMENT EXTREMITY (Left) LEFT THUMB CLOSED REDUCTION WITH PERCUTANEOUS PINNING (Left) as a surgical intervention.  The patient's history has been reviewed, patient examined, no change in status, stable for surgery.  I have reviewed the patient's chart and labs.  Questions were answered to the patient's satisfaction.     [name] [name]

## 2021-11-18 NOTE — Anesthesia Procedure Notes (Signed)
Procedure Name: LMA Insertion Date/Time: 11/18/2021 8:04 PM  Performed by: Valetta Fuller, CRNAPre-anesthesia Checklist: Emergency Drugs available, Patient identified, Suction available and Patient being monitored Patient Re-evaluated:Patient Re-evaluated prior to induction Oxygen Delivery Method: Circle system utilized Preoxygenation: Pre-oxygenation with 100% oxygen Induction Type: IV induction Ventilation: Mask ventilation without difficulty LMA: LMA inserted LMA Size: 4.0 Number of attempts: 1 Placement Confirmation: positive ETCO2 Tube secured with: Tape Dental Injury: Teeth and Oropharynx as per pre-operative assessment

## 2021-11-19 ENCOUNTER — Encounter (HOSPITAL_COMMUNITY): Payer: Self-pay | Admitting: Orthopedic Surgery

## 2021-11-19 ENCOUNTER — Other Ambulatory Visit: Payer: Self-pay | Admitting: Orthopedic Surgery

## 2021-11-19 ENCOUNTER — Telehealth: Payer: Self-pay | Admitting: Orthopedic Surgery

## 2021-11-19 MED ORDER — HYDROCODONE-ACETAMINOPHEN 5-325 MG PO TABS: 1.0000 | ORAL_TABLET | Freq: Four times a day (QID) | ORAL | 0 refills | Status: AC | PRN

## 2021-11-19 NOTE — Telephone Encounter (Signed)
Patient called in stating she would like the Oxycodone switched over to Hydrocodone because the oxy makes her feel woozy and she would like that called in to the [facility] on [address], [place], [place] [address], and how long is she supposed to keep her sling on please advise

## 2021-11-20 NOTE — Telephone Encounter (Signed)
I called patient and advised. 

## 2021-11-20 NOTE — Telephone Encounter (Signed)
Could you please advise on how long you would like for her to wear sling?

## 2021-11-24 ENCOUNTER — Ambulatory Visit (INDEPENDENT_AMBULATORY_CARE_PROVIDER_SITE_OTHER): Payer: No Typology Code available for payment source | Admitting: Orthopedic Surgery

## 2021-11-24 DIAGNOSIS — S62522B Displaced fracture of distal phalanx of left thumb, initial encounter for open fracture: Secondary | ICD-10-CM

## 2021-11-24 NOTE — Progress Notes (Signed)
Post-Op Visit Note   Patient: Donna Phillips           Date of Birth: 1970-10-20           MRN: [id number] Visit Date: 11/24/2021 PCP: Asencion Noble, MD   Assessment & Plan:  Chief Complaint:  Chief Complaint  Patient presents with   Left Thumb - Routine Post Op   Visit Diagnoses:  1. Open displaced fracture of distal phalanx of left thumb, initial encounter     Plan: Patient is one weeks s/p I&D of open left thumb injury with closed reduction and percutaneous pinning of distal phalanx fracture.  Wound is clean and dry today.  The coloration of her thumb has improved.  It was looking congested on the day of her surgery.  She still has limited sensation in the thumb tip which has been present since her injury.  She will be referred to therapy to have a brace made.  I will see her back next week for another wound check.   Follow-Up Instructions: No follow-ups on file.   Orders:  Orders Placed This Encounter  Procedures   Ambulatory referral to Occupational Therapy   No orders of the defined types were placed in this encounter.   Imaging: No results found.  PMFS History: Patient Active Problem List   Diagnosis Date Noted   Open displaced fracture of distal phalanx of left thumb 11/17/2021   Special screening for malignant neoplasms, colon    Adenomatous polyp of sigmoid colon    MDD (major depressive disorder), recurrent episode, mild (HCC) 06/22/2017   Ganglion cyst of dorsum of right wrist 05/11/2017   Past Medical History:  Diagnosis Date   Abnormal Pap smear    AMA (advanced maternal age) multigravida 35+    Anxiety    Depression    H/O varicella    Headache    HSV (herpes simplex virus) anogenital infection    Hypertension    PONV (postoperative nausea and vomiting)     Family History  Problem Relation Age of Onset   Heart disease Paternal Uncle    Diabetes Maternal Grandfather    Heart disease Maternal Grandfather    Heart attack Paternal Grandmother     Heart attack Paternal Grandfather    Stroke Paternal Grandfather    Heart attack Father     Past Surgical History:  Procedure Laterality Date   CESAREAN SECTION  02/27/2012   Procedure: CESAREAN SECTION;  Surgeon: Linda Hedges, DO;  Location: [facility];  Service: Obstetrics;  Laterality: N/A;  Primary cesarean section with delivery of baby  girl at 2.  Apgars9/9.   CLOSED REDUCTION FINGER WITH PERCUTANEOUS PINNING Left 11/18/2021   Procedure: LEFT THUMB CLOSED REDUCTION WITH PERCUTANEOUS PINNING;  Surgeon: Sherilyn Cooter, MD;  Location: [facility];  Service: Orthopedics;  Laterality: Left;   COLONOSCOPY WITH PROPOFOL N/A 04/23/2021   Procedure: COLONOSCOPY WITH PROPOFOL;  Surgeon: Aviva Signs, MD;  [facility];  Service: Gastroenterology;  Laterality: N/A;   HERNIA REPAIR  1974   I & D EXTREMITY Left 11/18/2021   Procedure: LEFT THUMB IRRIGATION AND DEBRIDEMENT EXTREMITY;  Surgeon: Sherilyn Cooter, MD;  Location: [facility];  Service: Orthopedics;  Laterality: Left;   LEEP     POLYPECTOMY  04/23/2021   Procedure: POLYPECTOMY;  Surgeon: Aviva Signs, MD;  [facility];  Service: Gastroenterology;;   TONSILLECTOMY  1993   Social History   Occupational History   Not on file  Tobacco  Use   Smoking status: Never   Smokeless tobacco: Never  Vaping Use   Vaping Use: Never used  Substance and Sexual Activity   Alcohol use: No   Drug use: No   Sexual activity: Yes    Birth control/protection: None

## 2021-11-26 ENCOUNTER — Encounter: Payer: Self-pay | Admitting: Rehabilitative and Restorative Service Providers"

## 2021-11-26 ENCOUNTER — Other Ambulatory Visit: Payer: Self-pay

## 2021-11-26 ENCOUNTER — Ambulatory Visit: Payer: No Typology Code available for payment source | Admitting: Rehabilitative and Restorative Service Providers"

## 2021-11-26 DIAGNOSIS — M6281 Muscle weakness (generalized): Secondary | ICD-10-CM

## 2021-11-26 DIAGNOSIS — M25542 Pain in joints of left hand: Secondary | ICD-10-CM

## 2021-11-26 DIAGNOSIS — M25642 Stiffness of left hand, not elsewhere classified: Secondary | ICD-10-CM

## 2021-11-26 DIAGNOSIS — R600 Localized edema: Secondary | ICD-10-CM

## 2021-11-26 DIAGNOSIS — R278 Other lack of coordination: Secondary | ICD-10-CM | POA: Diagnosis not present

## 2021-11-26 NOTE — Therapy (Signed)
OUTPATIENT OCCUPATIONAL THERAPY ORTHO EVALUATION  Patient Name: Donna Phillips MRN: [id number] DOB:December 30, 1970, 51 y.o., female Today's Date: 11/26/2021  PCP: Asencion Noble, MD REFERRING PROVIDER: Sherilyn Cooter, MD   OT End of Session - 11/26/21 1015     Visit Number 1    Number of Visits 14    Date for OT Re-Evaluation 01/29/22    Authorization Type [facility] Plan    OT Start Time 1017    OT Stop Time 1132    OT Time Calculation (min) 75 min    Equipment Utilized During Treatment orthotic materials    Activity Tolerance Patient tolerated treatment well;No increased pain;Patient limited by fatigue;Patient limited by pain    Behavior During Therapy [facility] for tasks assessed/performed             Past Medical History:  Diagnosis Date   Abnormal Pap smear    AMA (advanced maternal age) multigravida 35+    Anxiety    Depression    H/O varicella    Headache    HSV (herpes simplex virus) anogenital infection    Hypertension    PONV (postoperative nausea and vomiting)    Past Surgical History:  Procedure Laterality Date   CESAREAN SECTION  02/27/2012   Procedure: CESAREAN SECTION;  Surgeon: Linda Hedges, DO;  Location: [facility];  Service: Obstetrics;  Laterality: N/A;  Primary cesarean section with delivery of baby  girl at 45.  Apgars9/9.   CLOSED REDUCTION FINGER WITH PERCUTANEOUS PINNING Left 11/18/2021   Procedure: LEFT THUMB CLOSED REDUCTION WITH PERCUTANEOUS PINNING;  Surgeon: Sherilyn Cooter, MD;  Location: [facility];  Service: Orthopedics;  Laterality: Left;   COLONOSCOPY WITH PROPOFOL N/A 04/23/2021   Procedure: COLONOSCOPY WITH PROPOFOL;  Surgeon: Aviva Signs, MD;  [facility];  Service: Gastroenterology;  Laterality: N/A;   HERNIA REPAIR  1974   I & D EXTREMITY Left 11/18/2021   Procedure: LEFT THUMB IRRIGATION AND DEBRIDEMENT EXTREMITY;  Surgeon: Sherilyn Cooter, MD;  Location: [facility];  Service: Orthopedics;  Laterality: Left;   LEEP     POLYPECTOMY   04/23/2021   Procedure: POLYPECTOMY;  Surgeon: Aviva Signs, MD;  [facility];  Service: Gastroenterology;;   TONSILLECTOMY  1993   Patient Active Problem List   Diagnosis Date Noted   Open displaced fracture of distal phalanx of left thumb 11/17/2021   Special screening for malignant neoplasms, colon    Adenomatous polyp of sigmoid colon    MDD (major depressive disorder), recurrent episode, mild ([place]) 06/22/2017   Ganglion cyst of dorsum of right wrist 05/11/2017    ONSET DATE: DOS 11/18/21  REFERRING DIAG: I45.809X (ICD-10-CM) - Open displaced fracture of distal phalanx of left thumb, initial encounter  THERAPY DIAG:  Localized edema  Muscle weakness (generalized)  Other lack of coordination  Pain in joint of left hand  Stiffness of left hand, not elsewhere classified  Rationale for Evaluation and Treatment Rehabilitation  SUBJECTIVE:   SUBJECTIVE STATEMENT: She states that as she was breaking up a fight between her two dogs, one bit her left thumb, causing T2 fx and laceration.  She had debridement, k-wire fixation and a few sutures placed. She states no significant pain now, numbness and limited I/ADLs because she cannot safely use her thumb now.   PERTINENT HISTORY: 8 days post op now, A/PROM to all uninvolved joints as tolerated until 4-6 weeks and pins pulled; post pin removal AROM ok with orthotic at all other times, PROM after 1  additional week as tolerated, can d/c tip protector at 7-8 weeks as tolerated & start light hand strength as tolerated  PRECAUTIONS: NWB left hand now  PAIN:  Are you having pain? No 0/0 at rest now, but does c/o about some "swelling" feeing and numbness.   FALLS: Has patient fallen in last 6 months? No  LIVING ENVIRONMENT: Lives with: lives with their family  PLOF: Independent  PATIENT GOALS to get full use of left hand back  OBJECTIVE:   HAND DOMINANCE: Right   ADLs: Overall ADLs: States decreased ability to  grab, hold household objects, pain and inability to open containers, perform all FMS tasks, etc.    FUNCTIONAL OUTCOME MEASURES: Eval: Patient Specific Functional Scale: 5 (pick up gallon of milk, cooking tasks, doing laundry)  UPPER EXTREMITY ROM    Eval: ROM of proximal arm WNL, CMC and MCP Js of thumb with some swelling and limited motion, but no pain with motion while wearing orthotic.    Active ROM Left Eval  Thumb MCP (0-60) Approx 0-30*  Thumb IP (0-80) Pinned in extension  Thumb Radial abd/add (0-55) Approx 25*   Thumb Palmar abd/add (0-45) Approx 30*   Thumb Opposition to Small Finger Unable today   (Blank rows = not tested)   UPPER EXTREMITY MMT:    Eval: NT due to healing wounds   MMT Left TBD  Elbow flexion   Elbow extension   Wrist flexion   Wrist extension   Wrist pronation   Wrist supination   (Blank rows = not tested)  HAND FUNCTION: Eval: weak now and painful, grip NT due to fx;  Grip strength Left: TBD when safe   COORDINATION: Eval: overt coordination problems now that thumb use in contraindicated. TBD details when safe; 9 Hole Peg Test Left: TBD sec (TBD sec is WFL)   SENSATION: Eval: diminished LT in distal thumb, around sx areas     EDEMA:   Eval:  Mildly swollen in left thumb today  OBSERVATIONS:   Eval: no cardinal signs of infection (no overt redness, swelling, tenderness, pustulent discharge or smell, etc.)    TODAY'S TREATMENT:  Eval: Custom orthotic fabrication was indicated due to pt's haling T2 fx and wounds and need for safe, functional positioning. OT fabricated custom left thumb clamshell IP J immobilization orthotic for pt today to immobilize IP J and help keep swelling down. It fit well with no areas of pressure, pt states a comfortable fit. Pt was educated on the wearing schedule, to call or come in ASAP if it is causing any irritation or is not achieving desired function. It will be checked/adjusted in upcoming sessions, as  needed. Pt states understanding.   She was also edu on initial EP for AOM at uninvolved joints (thumb MCP J, CMC J, other fingers, wrist, etc.) as tolerated, and home/safety precautions with NWB for now.  OT also edu on self care safety with hygiene and not allow thumb/wound to get wet, but to clean pin sites 1 x day with sterile water/peroxide mix or alcohol as needed and wrap with clean cotton or xeroform dressing, wraps and protection orthotic. She states understanding, and OT does this wound care with her today. She leaves without pain or problem in orthotic.    PATIENT EDUCATION: Education details: See tx section above for details  Person educated: Patient Education method: Verbal Instruction, Teach back, Handouts  Education comprehension: States and demonstrates understanding, Additional Education required    HOME EXERCISE PROGRAM: See tx  section above for details   GOALS: Goals reviewed with patient? Yes   SHORT TERM GOALS: (STG required if POC>30 days)  Pt will obtain protective, custom orthotic. Target date: 11/26/21 Goal status: MET  2.  Pt will demo/state understanding of initial HEP to improve pain levels and prerequisite motion. Target date: 11/26/21 Goal status: Met   LONG TERM GOALS:  Pt will improve functional ability by decreased impairment per PSFS assessment from 5 to 9 or better, for better quality of life. Target date: 01/29/22 Goal status: INITIAL  2.  Pt will improve grip strength in left hand to at least 40lbs for functional use at home and in IADLs. Target date: 01/29/22 Goal status: INITIAL  3.  Pt will improve A/ROM in left thumb IP J to at least 45*, to have functional motion for tasks like reach and grasp.  Target date: 01/29/22 Goal status: INITIAL  4.  Pt will improve strength in left whole arm to at least 4+/5 MMT to have increased functional ability to carry out selfcare and higher-level homecare tasks with no difficulty. Target date:  01/29/22 Goal status: INITIAL     ASSESSMENT:  CLINICAL IMPRESSION: Patient is a 51 y.o. female who was seen today for occupational therapy evaluation for L thumb laceration and T2 fx with pinning, subsequent pain, swelling, infection risk, and decreased function with left hand. She will benefit from OP OT.    PERFORMANCE DEFICITS in functional skills including ADLs, IADLs, coordination, dexterity, sensation, edema, ROM, strength, pain, fascial restrictions, flexibility, FMC, GMC, body mechanics, endurance, decreased knowledge of precautions, wound, and UE functional use, cognitive skills including problem solving and safety awareness, and psychosocial skills including coping strategies, environmental adaptation, and routines and behaviors.   IMPAIRMENTS are limiting patient from ADLs, IADLs, work, and leisure.   COMORBIDITIES has co-morbidities such as depression  that affects occupational performance. Patient will benefit from skilled OT to address above impairments and improve overall function.  MODIFICATION OR ASSISTANCE TO COMPLETE EVALUATION: Min-Moderate modification of tasks or assist with assess necessary to complete an evaluation.  OT OCCUPATIONAL PROFILE AND HISTORY: Problem focused assessment: Including review of records relating to presenting problem.  CLINICAL DECISION MAKING: Moderate - several treatment options, min-mod task modification necessary  REHAB POTENTIAL: Excellent  EVALUATION COMPLEXITY: Low      PLAN: OT FREQUENCY: 1-2x/week  OT DURATION: 10 weeks (through 01/29/22, as needed for healing times, etc.)  PLANNED INTERVENTIONS: self care/ADL training, therapeutic exercise, therapeutic activity, neuromuscular re-education, manual therapy, scar mobilization, passive range of motion, splinting, ultrasound, fluidotherapy, biofeedback, compression bandaging, moist heat, cryotherapy, contrast bath, patient/family education, coping strategies training, DME and/or AE  instructions, and Re-evaluation  RECOMMENDED OTHER SERVICES: none now   CONSULTED AND AGREED WITH PLAN OF CARE: Patient  PLAN FOR NEXT SESSION: check orthotic, tolerance to HEP and recommendations, etc. Consider taking a pause on therapy until pins removed, if she has no issues and can cope with that.   Benito Mccreedy, OTR/L, CHT 11/26/2021, 12:07 PM

## 2021-12-21 ENCOUNTER — Ambulatory Visit: Payer: No Typology Code available for payment source | Admitting: Rehabilitative and Restorative Service Providers"

## 2021-12-21 ENCOUNTER — Encounter: Payer: Self-pay | Admitting: Rehabilitative and Restorative Service Providers"

## 2021-12-21 ENCOUNTER — Other Ambulatory Visit (HOSPITAL_COMMUNITY): Payer: Self-pay

## 2021-12-21 DIAGNOSIS — R600 Localized edema: Secondary | ICD-10-CM

## 2021-12-21 DIAGNOSIS — R278 Other lack of coordination: Secondary | ICD-10-CM

## 2021-12-21 DIAGNOSIS — M25542 Pain in joints of left hand: Secondary | ICD-10-CM | POA: Diagnosis not present

## 2021-12-21 DIAGNOSIS — M6281 Muscle weakness (generalized): Secondary | ICD-10-CM

## 2021-12-21 DIAGNOSIS — M25642 Stiffness of left hand, not elsewhere classified: Secondary | ICD-10-CM

## 2021-12-21 NOTE — Therapy (Signed)
OUTPATIENT OCCUPATIONAL THERAPY TREATMENT NOTE  Patient Name: Donna Phillips MRN: [id number] DOB:1970-12-30, 51 y.o., female Today's Date: 12/21/2021  PCP: Asencion Noble, MD REFERRING PROVIDER: Sherilyn Cooter, MD   OT End of Session - 12/21/21 1015     Visit Number 2    Number of Visits 14    Date for OT Re-Evaluation 01/29/22    Authorization Type [facility] [employer]    OT Start Time 8841    OT Stop Time 1101    OT Time Calculation (min) 46 min    Activity Tolerance Patient tolerated treatment well;No increased pain;Patient limited by fatigue;Patient limited by pain    Behavior During Therapy [facility] for tasks assessed/performed              Past Medical History:  Diagnosis Date   Abnormal Pap smear    AMA (advanced maternal age) multigravida 35+    Anxiety    Depression    H/O varicella    Headache    HSV (herpes simplex virus) anogenital infection    Hypertension    PONV (postoperative nausea and vomiting)    Past Surgical History:  Procedure Laterality Date   CESAREAN SECTION  02/27/2012   Procedure: CESAREAN SECTION;  Surgeon: Linda Hedges, DO;  Location: [place] [facility];  Service: Obstetrics;  Laterality: N/A;  Primary cesarean section with delivery of baby  girl at 1.  Apgars9/9.   CLOSED REDUCTION FINGER WITH PERCUTANEOUS PINNING Left 11/18/2021   Procedure: LEFT THUMB CLOSED REDUCTION WITH PERCUTANEOUS PINNING;  Surgeon: Sherilyn Cooter, MD;  Location: [place];  Service: Orthopedics;  Laterality: Left;   COLONOSCOPY WITH PROPOFOL N/A 04/23/2021   Procedure: COLONOSCOPY WITH PROPOFOL;  Surgeon: Aviva Signs, MD;  [facility];  Service: Gastroenterology;  Laterality: N/A;   HERNIA REPAIR  1974   I & D EXTREMITY Left 11/18/2021   Procedure: LEFT THUMB IRRIGATION AND DEBRIDEMENT EXTREMITY;  Surgeon: Sherilyn Cooter, MD;  Location: [place];  Service: Orthopedics;  Laterality: Left;   LEEP     POLYPECTOMY  04/23/2021   Procedure: POLYPECTOMY;  Surgeon: Aviva Signs,  MD;  [facility];  Service: Gastroenterology;;   TONSILLECTOMY  1993   Patient Active Problem List   Diagnosis Date Noted   Open displaced fracture of distal phalanx of left thumb 11/17/2021   Special screening for malignant neoplasms, colon    Adenomatous polyp of sigmoid colon    MDD (major depressive disorder), recurrent episode, mild ([place]) 06/22/2017   Ganglion cyst of dorsum of right wrist 05/11/2017    ONSET DATE: DOS 11/18/21  REFERRING DIAG: Y60.630Z (ICD-10-CM) - Open displaced fracture of distal phalanx of left thumb, initial encounter  THERAPY DIAG:  Localized edema  Muscle weakness (generalized)  Other lack of coordination  Pain in joint of left hand  Stiffness of left hand, not elsewhere classified  Rationale for Evaluation and Treatment Rehabilitation  PERTINENT HISTORY: ~5 weeks post op now, k-wire removed. A/AROM as tolerated this week, adding PROM by next week at least; is weaning from tip protector now as tol with caution due to good healing; at 7-8 weeks start light hand/wrist strength as tolerated  PRECAUTIONS: <5# ok in Lt hand now as tolerated    SUBJECTIVE:   SUBJECTIVE STATEMENT: She arrives almost 4 weeks since last seen not wearing any cover, brace, etc., to left thumb.  She states having K-wire and stitches out last Friday and feeling pretty well. Very stiff and hand is weak. She states trying  her daughter's "squeeze ball" very lightly and trying to start moving IP J.    PAIN:  Are you having pain? No  0/0 at rest now, but does still have some numbness.    PATIENT GOALS to get full use of left hand back  OBJECTIVE: (All objective assessments below are from initial evaluation on: 11/26/21 unless otherwise specified.)    HAND DOMINANCE: Right   ADLs: Overall ADLs: States decreased ability to grab, hold household objects, pain and inability to open containers, perform all FMS tasks, etc.    FUNCTIONAL OUTCOME MEASURES: Eval:  Patient Specific Functional Scale: 5 (pick up gallon of milk, cooking tasks, doing laundry)  UPPER EXTREMITY ROM    Eval: ROM of proximal arm WNL, CMC and MCP Js of thumb with some swelling and limited motion, but no pain with motion while wearing orthotic.    Active ROM Left Eval Left 12/21/21  Thumb MCP (0-60) Approx 0-30* 0-37  Thumb IP (0-80) Pinned in extension 0-22  Thumb Radial abd/add (0-55) Approx 25*  50*  Thumb Palmar abd/add (0-45) Approx 30*  42*  Thumb Opposition to Small Finger Unable today  Able to tip, ~1cm gap to base of SF  (Blank rows = not tested)   UPPER EXTREMITY MMT:    Eval: NT due to healing wounds   MMT Left TBD  Elbow flexion   Elbow extension   Wrist flexion   Wrist extension   Wrist pronation   Wrist supination   (Blank rows = not tested)  HAND FUNCTION: Eval: weak now and painful, grip NT due to fx;  Grip strength Left: TBD when safe   COORDINATION:  Eval: overt coordination problems now that thumb use in contraindicated. TBD details when safe; 9 Hole Peg Test Left: TBD sec (TBD sec is WFL)   SENSATION: Eval: diminished LT in distal thumb, around sx areas     EDEMA:   Eval:  Mildly swollen in left thumb today  OBSERVATIONS:   12/21/21: All wounds are closed and scabbed now, skin fairly dry. No signs of infection or dehiscence    Eval: no cardinal signs of infection (no overt redness, swelling, tenderness, pustulent discharge or smell, etc.)    TODAY'S TREATMENT:  12/21/21: OT advises to only do very light fnl activities/weight bearing now as tolerated. Advises still use caution until at least 8 weeks post op, may need to put thumb brace in brace to rest or if she becomes painful. OT also edu on edema control, giving out new coban and compressive wrap. Doing Manual retrograde massage and manual scar mobilization. OT edu on upgraded HEP to her tolerance for the below (which she demo's back with no significant pain/problems, but apparent  stiffness):   Exercises - Thumb AROM MP Blocking  - 4-6 x daily - 1 sets - 10-15 reps - Seated Thumb IP Flexion AROM with Blocking  - 4-6 x daily - 1 sets - 10-15 reps - Seated Composite Thumb Flexion AROM  - 4-6 x daily - 1 sets - 10-15 reps - Thumb DIP PROM  - 4-6 x daily - 1 sets - 10-15 reps - Seated Thumb Circumduction AROM  - 4-6 x daily - 1 sets - 10-15 reps Do in 4-6 days as tolerated: - Stretch Thumb Down (DON'T do like the picture)   - 2-3 x daily - 3-5 reps - 15 sec hold   PATIENT EDUCATION: Education details: See tx section above for details  Person educated: Patient Education method:  Verbal Instruction, Teach back, Handouts  Education comprehension: States and demonstrates understanding, Additional Education required    HOME EXERCISE PROGRAM: Access Code: [email or web address] URL: [email or web address] Date: 12/21/2021 Prepared by: Benito Mccreedy  GOALS: Goals reviewed with patient? Yes   SHORT TERM GOALS: (STG required if POC>30 days)  Pt will obtain protective, custom orthotic. Target date: 11/26/21 Goal status: MET  2.  Pt will demo/state understanding of initial HEP to improve pain levels and prerequisite motion. Target date: 11/26/21 Goal status: Met   LONG TERM GOALS:  Pt will improve functional ability by decreased impairment per PSFS assessment from 5 to 9 or better, for better quality of life. Target date: 01/29/22 Goal status: INITIAL  2.  Pt will improve grip strength in left hand to at least 40lbs for functional use at home and in IADLs. Target date: 01/29/22 Goal status: INITIAL  3.  Pt will improve A/ROM in left thumb IP J to at least 45*, to have functional motion for tasks like reach and grasp.  Target date: 01/29/22 Goal status: INITIAL  4.  Pt will improve strength in left whole arm to at least 4+/5 MMT to have increased functional ability to carry out selfcare and higher-level homecare tasks with no difficulty. Target date:  01/29/22 Goal status: INITIAL     ASSESSMENT:  CLINICAL IMPRESSION: 12/21/21: Per pt, bone is well healed, but therapist still advises caution until at least 8 weeks with weight bearing, etc., due to typical bone healing periods. We can move more quickly though, if she is not developing pain or swelling. She is very stiff and this should be focused on before grip training, anyhow.    PLAN: OT FREQUENCY: 1-2x/week  OT DURATION: 10 weeks (through 01/29/22, as needed for healing times, etc.)  PLANNED INTERVENTIONS: self care/ADL training, therapeutic exercise, therapeutic activity, neuromuscular re-education, manual therapy, scar mobilization, passive range of motion, splinting, ultrasound, fluidotherapy, biofeedback, compression bandaging, moist heat, cryotherapy, contrast bath, patient/family education, coping strategies training, DME and/or AE instructions, and Re-evaluation  RECOMMENDED OTHER SERVICES: none now   CONSULTED AND AGREED WITH PLAN OF CARE: Patient  PLAN FOR NEXT SESSION:  See next week to add light fnl activity (Box & Blocks, 9HPT) to promote coordination, function. Do light stretches together and consider upgrading to light putty earl du to good healing.    Benito Mccreedy, OTR/L, CHT 12/21/2021, 12:20 PM

## 2021-12-28 ENCOUNTER — Encounter: Payer: Self-pay | Admitting: Rehabilitative and Restorative Service Providers"

## 2021-12-28 ENCOUNTER — Ambulatory Visit: Payer: No Typology Code available for payment source | Admitting: Rehabilitative and Restorative Service Providers"

## 2021-12-28 DIAGNOSIS — M25542 Pain in joints of left hand: Secondary | ICD-10-CM | POA: Diagnosis not present

## 2021-12-28 DIAGNOSIS — M6281 Muscle weakness (generalized): Secondary | ICD-10-CM | POA: Diagnosis not present

## 2021-12-28 DIAGNOSIS — R278 Other lack of coordination: Secondary | ICD-10-CM

## 2021-12-28 DIAGNOSIS — M25642 Stiffness of left hand, not elsewhere classified: Secondary | ICD-10-CM

## 2021-12-28 DIAGNOSIS — R600 Localized edema: Secondary | ICD-10-CM

## 2021-12-28 NOTE — Therapy (Signed)
OUTPATIENT OCCUPATIONAL THERAPY TREATMENT NOTE  Patient Name: Donna Phillips MRN: [id number] DOB:[birth year], 51 y.o., female Today's Date: 12/28/2021  PCP: Asencion Noble, MD REFERRING PROVIDER: Sherilyn Cooter, MD   OT End of Session - 12/28/21 1020     Visit Number 3    Number of Visits 14    Date for OT Re-Evaluation 01/29/22    Authorization Type [facility] [employer]    OT Start Time 1020    OT Stop Time 1109    OT Time Calculation (min) 49 min    Equipment Utilized During Treatment t-putty, dycem    Activity Tolerance Patient tolerated treatment well;No increased pain;Patient limited by fatigue;Patient limited by pain    Behavior During Therapy [facility] for tasks assessed/performed               Past Medical History:  Diagnosis Date   Abnormal Pap smear    AMA (advanced maternal age) multigravida 35+    Anxiety    Depression    H/O varicella    Headache    HSV (herpes simplex virus) anogenital infection    Hypertension    PONV (postoperative nausea and vomiting)    Past Surgical History:  Procedure Laterality Date   CESAREAN SECTION  02/27/2012   Procedure: CESAREAN SECTION;  Surgeon: Linda Hedges, DO;  Location: [place] [facility];  Service: Obstetrics;  Laterality: N/A;  Primary cesarean section with delivery of baby  girl at 77.  Apgars9/9.   CLOSED REDUCTION FINGER WITH PERCUTANEOUS PINNING Left 11/18/2021   Procedure: LEFT THUMB CLOSED REDUCTION WITH PERCUTANEOUS PINNING;  Surgeon: Sherilyn Cooter, MD;  Location: [place];  Service: Orthopedics;  Laterality: Left;   COLONOSCOPY WITH PROPOFOL N/A 04/23/2021   Procedure: COLONOSCOPY WITH PROPOFOL;  Surgeon: Aviva Signs, MD;  [facility];  Service: Gastroenterology;  Laterality: N/A;   HERNIA REPAIR  1974   I & D EXTREMITY Left 11/18/2021   Procedure: LEFT THUMB IRRIGATION AND DEBRIDEMENT EXTREMITY;  Surgeon: Sherilyn Cooter, MD;  Location: [place];  Service: Orthopedics;  Laterality: Left;   LEEP     POLYPECTOMY   04/23/2021   Procedure: POLYPECTOMY;  Surgeon: Aviva Signs, MD;  [facility];  Service: Gastroenterology;;   TONSILLECTOMY  1993   Patient Active Problem List   Diagnosis Date Noted   Open displaced fracture of distal phalanx of left thumb 11/17/2021   Special screening for malignant neoplasms, colon    Adenomatous polyp of sigmoid colon    MDD (major depressive disorder), recurrent episode, mild ([place]) 06/22/2017   Ganglion cyst of dorsum of right wrist 05/11/2017    ONSET DATE: DOS 11/18/21  REFERRING DIAG: Y69.485I (ICD-10-CM) - Open displaced fracture of distal phalanx of left thumb, initial encounter  THERAPY DIAG:  Muscle weakness (generalized)  Other lack of coordination  Stiffness of left hand, not elsewhere classified  Pain in joint of left hand  Localized edema  Rationale for Evaluation and Treatment Rehabilitation  PERTINENT HISTORY: 5+ weeks post op now, k-wire removed. A/AROM as tolerated this week, adding PROM by next week at least; is weaning from tip protector now as tol with caution due to good healing; at 7-8 weeks start light hand/wrist strength as tolerated  PRECAUTIONS: <5# ok in Lt hand now as tolerated    SUBJECTIVE:   SUBJECTIVE STATEMENT: She states doing "just fine" no pain or problems, still a bit numb to tip of Lt thumb, but tolerating HEP and activities very well.     PAIN:  Are you having pain? No  0/0 at rest now, but does still have some numbness.    PATIENT GOALS to get full use of left hand back  OBJECTIVE: (All objective assessments below are from initial evaluation on: 11/26/21 unless otherwise specified.)    HAND DOMINANCE: Right   ADLs: Overall ADLs: States decreased ability to grab, hold household objects, pain and inability to open containers, perform all FMS tasks, etc.    FUNCTIONAL OUTCOME MEASURES: Eval: Patient Specific Functional Scale: 5 (pick up gallon of milk, cooking tasks, doing laundry)  UPPER  EXTREMITY ROM    Eval: ROM of proximal arm WNL, CMC and MCP Js of thumb with some swelling and limited motion, but no pain with motion while wearing orthotic.    Active ROM Left Eval Left 12/21/21 Lt  12/28/21  Thumb MCP (0-60) Approx 0-30* 0-37 0-45  Thumb IP (0-80) Pinned in extension 0-22 0-32  Thumb Radial abd/add (0-55) Approx 25*  50*   Thumb Palmar abd/add (0-45) Approx 30*  42*   Thumb Opposition to Small Finger Unable today  Able to tip, ~1cm gap to base of SF   (Blank rows = not tested)   UPPER EXTREMITY MMT:    Eval: NT due to healing wounds   MMT Left TBD  Elbow flexion   Elbow extension   Wrist flexion   Wrist extension   Wrist pronation   Wrist supination   (Blank rows = not tested)  HAND FUNCTION: Eval: weak now and painful, grip NT due to fx;  Grip strength Left: TBD when safe   COORDINATION: 12/28/21: Box & Blocks LEFT 60 today (59 is WFL, 78 is AVG)  9HPT LEFT 26sec today  (WFL is 24sec or better, 18sec is AVG)   Eval: overt coordination problems now that thumb use in contraindicated. TBD details when safe; 9 Hole Peg Test Left: TBD sec (TBD sec is WFL)   SENSATION: Eval: diminished LT in distal thumb, around sx areas     EDEMA:   Eval:  Mildly swollen in left thumb today  OBSERVATIONS:   12/21/21: All wounds are closed and scabbed now, skin fairly dry. No signs of infection or dehiscence    Eval: no cardinal signs of infection (no overt redness, swelling, tenderness, pustulent discharge or smell, etc.)    TODAY'S TREATMENT:  12/28/21: She starts with AROM for new measures, which are all better and she is less tender now to TTP, tip of thumb looking more healed but still has some crusty scab. She also performs GMS and FMS coordination activities and is [facility] but still lacking in [place].   OT reviews HEP with her as she performs (as below) and then upgrades her to light stretches to all joints of Lt thumb and light pinch and grip strength as she has  no pain with it and no soreness or TTP. Also given blue dycem for scar mobilizations in 3 way to be done 3 x day for 2 mins each time. Lastly, OT also edu on in-hand manipulation coordination activities with pen to be done in shift, translation, rotation. She performs back and states understanding.   Exercises - Seated Composite Thumb Flexion AROM  - 4-6 x daily - 1 sets - 10-15 reps - Thumb AROM MP Blocking  - 4-6 x daily - 1 sets - 10-15 reps - Seated Thumb IP Flexion AROM with Blocking  - 4-6 x daily - 1 sets - 10-15 reps - Seated Thumb Circumduction  AROM  - 4-6 x daily - 1 sets - 10-15 reps - Stretch Thumb Down (DON'T do like the picture)   - 3-4 x daily - 3-5 reps - 15 sec hold - Full Fist  - 1-3 x daily - 5 reps - Finger Extension "Pizza!"   - 2-3 x daily - 5 reps - Finger Key Grip with Putty  - 2-3 x daily - 5 reps - Thumb Opposition with Putty  - 2-3 x daily - 3-5 reps   PATIENT EDUCATION: Education details: See tx section above for details  Person educated: Patient Education method: Verbal Instruction, Teach back, Handouts  Education comprehension: States and demonstrates understanding, Additional Education required    HOME EXERCISE PROGRAM: Access Code: [email or web address] URL: [email or web address]   GOALS: Goals reviewed with patient? Yes   SHORT TERM GOALS: (STG required if POC>30 days)  Pt will obtain protective, custom orthotic. Target date: 11/26/21 Goal status: MET  2.  Pt will demo/state understanding of initial HEP to improve pain levels and prerequisite motion. Target date: 11/26/21 Goal status: Met   LONG TERM GOALS:  Pt will improve functional ability by decreased impairment per PSFS assessment from 5 to 9 or better, for better quality of life. Target date: 01/29/22 Goal status: INITIAL  2.  Pt will improve grip strength in left hand to at least 40lbs for functional use at home and in IADLs. Target date: 01/29/22 Goal status: INITIAL  3.  Pt  will improve A/ROM in left thumb IP J to at least 45*, to have functional motion for tasks like reach and grasp.  Target date: 01/29/22 Goal status: INITIAL  4.  Pt will improve strength in left whole arm to at least 4+/5 MMT to have increased functional ability to carry out selfcare and higher-level homecare tasks with no difficulty. Target date: 01/29/22 Goal status: INITIAL     ASSESSMENT:  CLINICAL IMPRESSION: 12/28/21: She is doing so well, without pain, that OT doesn't see medical need to see her in the next week, but she is asked to schedule for the following week for reassessment and likely discharge, if no issues. Se is told to come in earlier if she has any pain or issues. She was cautioned to not return to high-level "full heavy grip or repetitive work" or sports for 8-10 weeks, per safety guidelines.    PLAN: OT FREQUENCY: 1-2x/week  OT DURATION: 10 weeks (through 01/29/22, as needed for healing times, etc.)  PLANNED INTERVENTIONS: self care/ADL training, therapeutic exercise, therapeutic activity, neuromuscular re-education, manual therapy, scar mobilization, passive range of motion, splinting, ultrasound, fluidotherapy, biofeedback, compression bandaging, moist heat, cryotherapy, contrast bath, patient/family education, coping strategies training, DME and/or AE instructions, and Re-evaluation  RECOMMENDED OTHER SERVICES: none now   CONSULTED AND AGREED WITH PLAN OF CARE: Patient  PLAN FOR NEXT SESSION:  F/U in 2 weeks for progress note and likely discharge from therapy.    Benito Mccreedy, OTR/L, CHT 12/28/2021, 11:23 AM

## 2022-01-05 ENCOUNTER — Encounter: Payer: No Typology Code available for payment source | Admitting: Rehabilitative and Restorative Service Providers"

## 2022-01-11 ENCOUNTER — Encounter: Payer: Self-pay | Admitting: Rehabilitative and Restorative Service Providers"

## 2022-01-11 ENCOUNTER — Ambulatory Visit: Payer: No Typology Code available for payment source | Admitting: Rehabilitative and Restorative Service Providers"

## 2022-01-11 DIAGNOSIS — M25542 Pain in joints of left hand: Secondary | ICD-10-CM | POA: Diagnosis not present

## 2022-01-11 DIAGNOSIS — M25642 Stiffness of left hand, not elsewhere classified: Secondary | ICD-10-CM

## 2022-01-11 DIAGNOSIS — R278 Other lack of coordination: Secondary | ICD-10-CM | POA: Diagnosis not present

## 2022-01-11 DIAGNOSIS — R600 Localized edema: Secondary | ICD-10-CM

## 2022-01-11 DIAGNOSIS — M6281 Muscle weakness (generalized): Secondary | ICD-10-CM

## 2022-01-11 NOTE — Therapy (Signed)
OUTPATIENT OCCUPATIONAL THERAPY TREATMENT & DISCHARGE NOTE  Patient Name: Donna Phillips MRN: [id number] DOB:1970-12-21, 51 y.o., female Today's Date: 01/11/2022  PCP: Asencion Noble, MD REFERRING PROVIDER: Sherilyn Cooter, MD   OT End of Session - 01/11/22 1015     Visit Number 4    Number of Visits 14    Date for OT Re-Evaluation 01/29/22    Authorization Type [facility]    OT Start Time 1015    OT Stop Time 1046    OT Time Calculation (min) 31 min    Equipment Utilized During Treatment --    Activity Tolerance Patient tolerated treatment well;No increased pain    Behavior During Therapy WFL for tasks assessed/performed             Past Medical History:  Diagnosis Date   Abnormal Pap smear    AMA (advanced maternal age) multigravida 35+    Anxiety    Depression    H/O varicella    Headache    HSV (herpes simplex virus) anogenital infection    Hypertension    PONV (postoperative nausea and vomiting)    Past Surgical History:  Procedure Laterality Date   CESAREAN SECTION  02/27/2012   Procedure: CESAREAN SECTION;  Surgeon: Linda Hedges, DO;  Location: [facility];  Service: Obstetrics;  Laterality: N/A;  Primary cesarean section with delivery of baby  girl at 77.  Apgars9/9.   CLOSED REDUCTION FINGER WITH PERCUTANEOUS PINNING Left 11/18/2021   Procedure: LEFT THUMB CLOSED REDUCTION WITH PERCUTANEOUS PINNING;  Surgeon: Sherilyn Cooter, MD;  Location: [facility];  Service: Orthopedics;  Laterality: Left;   COLONOSCOPY WITH PROPOFOL N/A 04/23/2021   Procedure: COLONOSCOPY WITH PROPOFOL;  Surgeon: Aviva Signs, MD;  [facility];  Service: Gastroenterology;  Laterality: N/A;   HERNIA REPAIR  1974   I & D EXTREMITY Left 11/18/2021   Procedure: LEFT THUMB IRRIGATION AND DEBRIDEMENT EXTREMITY;  Surgeon: Sherilyn Cooter, MD;  Location: [facility];  Service: Orthopedics;  Laterality: Left;   LEEP     POLYPECTOMY  04/23/2021   Procedure: POLYPECTOMY;  Surgeon: Aviva Signs, MD;  Location: [facility];  Service: Gastroenterology;;   TONSILLECTOMY  1993   Patient Active Problem List   Diagnosis Date Noted   Open displaced fracture of distal phalanx of left thumb 11/17/2021   Special screening for malignant neoplasms, colon    Adenomatous polyp of sigmoid colon    MDD (major depressive disorder), recurrent episode, mild ([place]) 06/22/2017   Ganglion cyst of dorsum of right wrist 05/11/2017    ONSET DATE: DOS 11/18/21  REFERRING DIAG: J68.115B (ICD-10-CM) - Open displaced fracture of distal phalanx of left thumb, initial encounter  THERAPY DIAG:  Muscle weakness (generalized)  Other lack of coordination  Stiffness of left hand, not elsewhere classified  Pain in joint of left hand  Localized edema  Rationale for Evaluation and Treatment Rehabilitation  PERTINENT HISTORY: 7+ weeks post op now, WBAT  PRECAUTIONS: WBAT now    SUBJECTIVE:   SUBJECTIVE STATEMENT: She returns after 2 weeks of self-management. She states getting back to using elliptical, now hand strength is no issues   PAIN:  Are you having pain? No  0/0 at rest now, but does still have some numbness.    PATIENT GOALS to get full use of left hand back  OBJECTIVE: (All objective assessments below are from initial evaluation on: 11/26/21 unless otherwise specified.)   ADLs: Overall ADLs: 01/01/22: states lifting and cooking is  fine now    FUNCTIONAL OUTCOME MEASURES: 01/11/22: PSFS 10 today  Eval: Patient Specific Functional Scale: 5 (pick up gallon of milk, cooking tasks, doing laundry)  UPPER EXTREMITY ROM     Active ROM Left Eval Left 12/21/21 Lt  12/28/21 Lt  01/11/22  Thumb MCP (0-60) Approx 0-30* 0-37 0-45 0-49  Thumb IP (0-80) Pinned in extension 0-22 0-32 0-33 (other hand: 65)  Thumb Radial abd/add (0-55) Approx 25*  50*    Thumb Palmar abd/add (0-45) Approx 30*  42*    Thumb Opposition to Small Finger Unable today  Able to tip, ~1cm gap to base of SF   ~0.5cm gap to bas of SF   (Blank rows = not tested)   UPPER EXTREMITY MMT:     MMT Left 01/11/22  Wrist flexion 5/5  Wrist extension 5/5  (Blank rows = not tested)  HAND FUNCTION: 01/11/22: Lt grip strength: 37# (53# Rt)  Eval: weak now and painful, grip NT due to fx;  Grip strength Left: TBD when safe   COORDINATION: 01/11/22: 9HPT LEFT 21sec today  (WFL is 24sec or better, 18sec is AVG)   12/28/21: Box & Blocks LEFT 60 today (59 is WFL, 78 is AVG)  9HPT LEFT 26sec today  (WFL is 24sec or better, 18sec is AVG)   Eval: overt coordination problems now that thumb use in contraindicated. TBD details when safe; 9 Hole Peg Test Left: TBD sec (TBD sec is WFL)   SENSATION: 01/11/22: She states feeling in thumb now, still some mild decrease but tested ~51m with static 2PD test (WNL)   EDEMA:   01/11/22: none now   Eval:  Mildly swollen in left thumb today  OBSERVATIONS:   01/11/22: scars looking well healed, smoother, IP J still somewhat stiff, but she's working it.   12/21/21: All wounds are closed and scabbed now, skin fairly dry. No signs of infection or dehiscence    Eval: no cardinal signs of infection (no overt redness, swelling, tenderness, pustulent discharge or smell, etc.)    TODAY'S TREATMENT:  01/11/22: Pt performs AROM for review of HEP as last instructed.  She is also now tolerating gripping and strength at hand and wrist against therapist's resistance for exercise/activities as well as new measures today. Using that data, OT updates home exercises to include hand and wrist strength and provides appropriate upgrades as below. OT also discusses home and functional tasks with the pt and reviews goals. She was instructed to progressively work towards full motion and strength over the next 2-3 weeks attempting to return to all desired activities. Pt states understanding and tolerates upgrades well.    Exercises - Seated Composite Thumb Flexion AROM  - 4-6 x daily - 1  sets - 10-15 reps - Thumb AROM MP Blocking  - 4-6 x daily - 1 sets - 10-15 reps - Seated Thumb IP Flexion AROM with Blocking  - 4-6 x daily - 1 sets - 10-15 reps - Seated Thumb Circumduction AROM  - 4-6 x daily - 1 sets - 10-15 reps - Stretch Thumb Down (DON'T do like the picture)   - 3-4 x daily - 3-5 reps - 15 sec hold - Full Fist  - 1-3 x daily - 5 reps - Finger Extension "Pizza!"   - 2-3 x daily - 5 reps - Finger Key Grip with Putty  - 2-3 x daily - 5 reps - Thumb Opposition with Putty  - 2-3 x daily - 3-5 reps - Wrist  Flexion with Dumbbell  - 2-4 x daily - 1 sets - 10-15 reps - Wrist Extension with Dumbbell  - 2-4 x daily - 1 sets - 10-15 reps - Seated Wrist Radial Deviation with Dumbbell  - 2-4 x daily - 1 sets - 10-15 reps   PATIENT EDUCATION: Education details: See tx section above for details  Person educated: Patient Education method: Verbal Instruction, Teach back, Handouts  Education comprehension: States and demonstrates understanding  HOME EXERCISE PROGRAM: Access Code: [email or web address] URL: [email or web address]   GOALS: Goals reviewed with patient? Yes   SHORT TERM GOALS: (STG required if POC>30 days)  Pt will obtain protective, custom orthotic. Target date: 11/26/21 Goal status: MET  2.  Pt will demo/state understanding of initial HEP to improve pain levels and prerequisite motion. Target date: 11/26/21 Goal status: Met   LONG TERM GOALS:  Pt will improve functional ability by decreased impairment per PSFS assessment from 5 to 9 or better, for better quality of life. Target date: 01/29/22 Goal status:01/11/22: MET  2.  Pt will improve grip strength in left hand to at least 40lbs for functional use at home and in IADLs. Target date: 01/29/22 Goal status: 01/11/22: Considered Met 37# today no pain  3.  Pt will improve A/ROM in left thumb IP J to at least 45*, to have functional motion for tasks like reach and grasp.  Target date: 01/29/22 Goal  status: 01/11/22: Not MET, but she will continue to work on this as needed  4.  Pt will improve strength in left whole arm to at least 4+/5 MMT to have increased functional ability to carry out selfcare and higher-level homecare tasks with no difficulty. Target date: 01/29/22 Goal status: 01/11/22: 5/5 today (except for thumb itself still 4/5), and will work on conditioning.      ASSESSMENT:  CLINICAL IMPRESSION: 01/11/22: Although IP J is still tight, she doesn't desire any mobilizing orthotic, and states full function and ability to work on getting back mor emotion herself.  OT in agreement, and she meets D/C criteria today, as planned.   PLAN: OT FREQUENCY: D/C  OT DURATION: D/C  PLANNED INTERVENTIONS: self care/ADL training, therapeutic exercise, therapeutic activity, neuromuscular re-education, manual therapy, scar mobilization, passive range of motion, splinting, ultrasound, fluidotherapy, biofeedback, compression bandaging, moist heat, cryotherapy, contrast bath, patient/family education, coping strategies training, DME and/or AE instructions, and Re-evaluation  RECOMMENDED OTHER SERVICES: none now   CONSULTED AND AGREED WITH PLAN OF CARE: Patient  PLAN FOR NEXT SESSION:  successful D/C from OP OT today, as planned    Benito Mccreedy, OTR/L, CHT 01/11/2022, 10:50 AM    OCCUPATIONAL THERAPY DISCHARGE SUMMARY  Visits from Start of Care: 4  Current functional level related to goals / functional outcomes: Pt has met all goals but 1 to satisfactory levels and is pleased with outcomes.   Remaining deficits: Pt has no more significant functional deficits or pain.   Education / Equipment: Pt has all needed materials and education. Pt understands how to continue on with self-management. See tx notes for more details.   Patient agrees to discharge due to max benefits received from outpatient occupational therapy / hand therapy at this time.   Benito Mccreedy, OTR/L,  CHT 01/11/22

## 2022-01-13 ENCOUNTER — Other Ambulatory Visit (HOSPITAL_COMMUNITY): Payer: Self-pay | Admitting: Internal Medicine

## 2022-01-13 DIAGNOSIS — Z1231 Encounter for screening mammogram for malignant neoplasm of breast: Secondary | ICD-10-CM

## 2022-01-18 ENCOUNTER — Other Ambulatory Visit (HOSPITAL_COMMUNITY): Payer: Self-pay

## 2022-01-19 ENCOUNTER — Other Ambulatory Visit (HOSPITAL_COMMUNITY): Payer: Self-pay

## 2022-01-28 ENCOUNTER — Other Ambulatory Visit (HOSPITAL_COMMUNITY): Payer: Self-pay

## 2022-02-03 ENCOUNTER — Ambulatory Visit (HOSPITAL_COMMUNITY): Payer: No Typology Code available for payment source

## 2022-02-04 ENCOUNTER — Ambulatory Visit (HOSPITAL_COMMUNITY)
Admission: RE | Admit: 2022-02-04 | Discharge: 2022-02-04 | Disposition: A | Discharge status: Home / Self Care | Payer: No Typology Code available for payment source | Source: Ambulatory Visit | Attending: Internal Medicine | Admitting: Internal Medicine

## 2022-02-04 DIAGNOSIS — Z1231 Encounter for screening mammogram for malignant neoplasm of breast: Secondary | ICD-10-CM | POA: Insufficient documentation

## 2022-03-15 ENCOUNTER — Other Ambulatory Visit: Payer: Self-pay

## 2022-03-16 ENCOUNTER — Other Ambulatory Visit (HOSPITAL_COMMUNITY): Payer: Self-pay

## 2022-04-11 ENCOUNTER — Other Ambulatory Visit (HOSPITAL_COMMUNITY): Payer: Self-pay | Admitting: Psychiatry

## 2022-04-11 DIAGNOSIS — F330 Major depressive disorder, recurrent, mild: Secondary | ICD-10-CM

## 2022-04-11 DIAGNOSIS — F419 Anxiety disorder, unspecified: Secondary | ICD-10-CM

## 2022-04-12 ENCOUNTER — Other Ambulatory Visit (HOSPITAL_COMMUNITY): Payer: Self-pay

## 2022-04-16 ENCOUNTER — Other Ambulatory Visit (HOSPITAL_COMMUNITY): Payer: Self-pay

## 2022-04-19 ENCOUNTER — Other Ambulatory Visit (HOSPITAL_COMMUNITY): Payer: Self-pay

## 2022-04-20 ENCOUNTER — Other Ambulatory Visit (HOSPITAL_COMMUNITY): Payer: Self-pay

## 2022-04-26 ENCOUNTER — Other Ambulatory Visit (HOSPITAL_COMMUNITY): Payer: Self-pay | Admitting: Psychiatry

## 2022-04-26 DIAGNOSIS — F330 Major depressive disorder, recurrent, mild: Secondary | ICD-10-CM

## 2022-04-26 DIAGNOSIS — F419 Anxiety disorder, unspecified: Secondary | ICD-10-CM

## 2022-05-04 ENCOUNTER — Other Ambulatory Visit (HOSPITAL_COMMUNITY): Payer: Self-pay

## 2022-05-04 ENCOUNTER — Other Ambulatory Visit: Payer: Self-pay

## 2022-05-04 ENCOUNTER — Encounter (HOSPITAL_COMMUNITY): Payer: Self-pay | Admitting: Psychiatry

## 2022-05-04 ENCOUNTER — Telehealth (HOSPITAL_BASED_OUTPATIENT_CLINIC_OR_DEPARTMENT_OTHER): Payer: 59 | Admitting: Psychiatry

## 2022-05-04 DIAGNOSIS — F419 Anxiety disorder, unspecified: Secondary | ICD-10-CM

## 2022-05-04 DIAGNOSIS — F330 Major depressive disorder, recurrent, mild: Secondary | ICD-10-CM | POA: Diagnosis not present

## 2022-05-04 MED ORDER — DULOXETINE HCL 20 MG PO CPEP
20.0000 mg | ORAL_CAPSULE | Freq: Every day | ORAL | 1 refills | Status: DC
  Filled 2022-05-04 (×2): qty 90, 90d supply, fill #0
  Filled 2022-07-28: qty 90, 90d supply, fill #1

## 2022-05-04 NOTE — Progress Notes (Signed)
Virtual Visit via Video Note  I connected with Donna Phillips on 05/04/22 at  8:20 AM EST by a video enabled telemedicine application and verified that I am speaking with the correct person using two identifiers.  Location: Patient: Home Provider: Home Office   I discussed the limitations of evaluation and management by telemedicine and the availability of in person appointments. The patient expressed understanding and agreed to proceed.  History of Present Illness: Patient is evaluated by video session.  She is taking Cymbalta 20 mg.  She reported her anxiety and depression is a stable.  She sleeps good.  Patient told 6 months ago she injured her left thumb while trying to separate her 2 dogs were fighting.  She was bit by one dog.  She required procedure and later rehab.  She is doing better and able to function with her left thumb.  Patient reported things are going well.  Her 37 year old daughter is at [employer] and doing well.  She denies any major panic attack, crying spells or any feeling of hopelessness or worthlessness.  She continues to engage in exercise and regular walking.  Her appetite is okay.  Energy level is good.  She denies any anhedonia or any paranoia.  She has no tremors, shakes or any EPS.   Past Psychiatric History: Reviewed. H/O depression and anxiety.  Took Prozac from 2005-2013 until got pregnant.  Tried briefly Lexapro but causes side effects.  Wellbutrin caused tremors, headaches and Zoloft dry mouth and weight gain.  No h/o inpatient treatment, suicidal attempt or mania.     Psychiatric Specialty Exam: Physical Exam  Review of Systems  Weight 118 lb (53.5 kg).There is no height or weight on file to calculate BMI.  General Appearance: Well Groomed  Eye Contact:  Good  Speech:  Clear and Coherent and Normal Rate  Volume:  Normal  Mood:  Euthymic  Affect:  Appropriate  Thought Process:  Goal Directed  Orientation:  Full (Time, Place, and Person)  Thought  Content:  WDL  Suicidal Thoughts:  No  Homicidal Thoughts:  No  Memory:  Immediate;   Good Recent;   Good Remote;   Good  Judgement:  Intact  Insight:  Present  Psychomotor Activity:  Normal  Concentration:  Concentration: Good and Attention Span: Good  Recall:  Good  Fund of Knowledge:  Good  Language:  Good  Akathisia:  No  Handed:  Right  AIMS (if indicated):     Assets:  Communication Skills Desire for [employer] Talents/Skills Transportation  ADL's:  Intact  Cognition:  WNL  Sleep:   ok      Assessment and Plan: Major depressive disorder, recurrent.  Anxiety.  Patient is stable on Cymbalta 20 mg daily.  She has no tremors or any side effects.  She is not taking any narcotic pain medication.  Her plan is to go to [place] with the family on spring break.  Discussed medication side effects and benefits.  Recommended to call us back if she has any question or any concern.  Follow-up in 6 months.  Follow Up Instructions:    I discussed the assessment and treatment plan with the patient. The patient was provided an opportunity to ask questions and all were answered. The patient agreed with the plan and demonstrated an understanding of the instructions.   The patient was advised to call back or seek an in-person evaluation if the symptoms worsen or if the condition fails to  improve as anticipated.  Collaboration of Care: Other provider involved in patient's care AEB notes are available in epic to review.  Patient/Guardian was advised Release of Information must be obtained prior to any record release in order to collaborate their care with an outside provider. Patient/Guardian was advised if they have not already done so to contact the registration department to sign all necessary forms in order for [name] to release information regarding their care.   Consent: Patient/Guardian gives verbal consent for treatment and assignment of benefits for  services provided during this visit. Patient/Guardian expressed understanding and agreed to proceed.    I provided 17 minutes of non-face-to-face time during this encounter.   [name], MD

## 2022-05-11 ENCOUNTER — Other Ambulatory Visit (HOSPITAL_COMMUNITY): Payer: Self-pay

## 2022-05-21 DIAGNOSIS — R739 Hyperglycemia, unspecified: Secondary | ICD-10-CM | POA: Diagnosis not present

## 2022-05-21 DIAGNOSIS — F329 Major depressive disorder, single episode, unspecified: Secondary | ICD-10-CM | POA: Diagnosis not present

## 2022-05-21 DIAGNOSIS — Z79899 Other long term (current) drug therapy: Secondary | ICD-10-CM | POA: Diagnosis not present

## 2022-05-28 ENCOUNTER — Other Ambulatory Visit (HOSPITAL_COMMUNITY): Payer: Self-pay

## 2022-05-28 DIAGNOSIS — F325 Major depressive disorder, single episode, in full remission: Secondary | ICD-10-CM | POA: Diagnosis not present

## 2022-05-28 DIAGNOSIS — G43909 Migraine, unspecified, not intractable, without status migrainosus: Secondary | ICD-10-CM | POA: Diagnosis not present

## 2022-06-07 ENCOUNTER — Other Ambulatory Visit (HOSPITAL_COMMUNITY): Payer: Self-pay

## 2022-06-07 ENCOUNTER — Other Ambulatory Visit: Payer: Self-pay

## 2022-07-16 ENCOUNTER — Other Ambulatory Visit: Payer: Self-pay

## 2022-07-28 ENCOUNTER — Other Ambulatory Visit (HOSPITAL_COMMUNITY): Payer: Self-pay

## 2022-08-09 ENCOUNTER — Other Ambulatory Visit (HOSPITAL_COMMUNITY): Payer: Self-pay

## 2022-08-09 DIAGNOSIS — Z6821 Body mass index (BMI) 21.0-21.9, adult: Secondary | ICD-10-CM | POA: Diagnosis not present

## 2022-08-09 DIAGNOSIS — Z01419 Encounter for gynecological examination (general) (routine) without abnormal findings: Secondary | ICD-10-CM | POA: Diagnosis not present

## 2022-08-09 DIAGNOSIS — Z309 Encounter for contraceptive management, unspecified: Secondary | ICD-10-CM | POA: Diagnosis not present

## 2022-08-09 MED ORDER — NORETHIN ACE-ETH ESTRAD-FE 1.5-30 MG-MCG PO TABS
1.0000 | ORAL_TABLET | Freq: Every day | ORAL | 3 refills | Status: DC
  Filled 2022-08-26: qty 84, 84d supply, fill #0
  Filled 2022-11-22: qty 84, 84d supply, fill #1

## 2022-08-26 ENCOUNTER — Other Ambulatory Visit: Payer: Self-pay

## 2022-10-12 ENCOUNTER — Other Ambulatory Visit (HOSPITAL_COMMUNITY): Payer: Self-pay

## 2022-11-02 ENCOUNTER — Telehealth (HOSPITAL_COMMUNITY): Payer: 59 | Admitting: Psychiatry

## 2022-11-04 ENCOUNTER — Other Ambulatory Visit (HOSPITAL_COMMUNITY): Payer: Self-pay

## 2022-11-04 ENCOUNTER — Telehealth (HOSPITAL_COMMUNITY): Payer: Self-pay | Admitting: *Deleted

## 2022-11-04 ENCOUNTER — Other Ambulatory Visit (HOSPITAL_COMMUNITY): Payer: Self-pay | Admitting: *Deleted

## 2022-11-04 ENCOUNTER — Other Ambulatory Visit: Payer: Self-pay

## 2022-11-04 MED ORDER — FLUOXETINE HCL 10 MG PO CAPS
ORAL_CAPSULE | ORAL | 0 refills | Status: DC
  Filled 2022-11-04: qty 71, 39d supply, fill #0

## 2022-11-04 NOTE — Telephone Encounter (Signed)
She can try Prozac 10 mg daily for 1 week and then 20 mg.  There could be a withdrawal from Cymbalta so tell her to take few days at least Cymbalta every other day for 1 week.  She agree please call prescription to her pharmacy

## 2022-11-04 NOTE — Telephone Encounter (Signed)
Pt advised and verbalizes understanding. Prescription sent to [facility].

## 2022-11-04 NOTE — Telephone Encounter (Signed)
Pt called with c/o that Cymbalta causes s/e such as weight gain (pt says currently @ 120 lbs), headache with n/v, and increased constipation. Pt says she has tried Prozac and tolerated that well with no s/e. Pt doesn't remember why she stopped taking. Per your note on 03/08/19 she was on Prozac from 2005-2013 but d/c when she became pregnant. Pt next visit rescheduled to 12/06/22 from 11/02/22. Last visit 05/04/22. Please review and advise.

## 2022-11-18 ENCOUNTER — Other Ambulatory Visit (HOSPITAL_COMMUNITY): Payer: Self-pay

## 2022-11-22 ENCOUNTER — Other Ambulatory Visit (HOSPITAL_COMMUNITY): Payer: Self-pay

## 2022-11-24 ENCOUNTER — Other Ambulatory Visit (HOSPITAL_COMMUNITY): Payer: Self-pay

## 2022-12-06 ENCOUNTER — Other Ambulatory Visit (HOSPITAL_COMMUNITY): Payer: Self-pay

## 2022-12-06 ENCOUNTER — Telehealth (HOSPITAL_COMMUNITY): Admitting: Psychiatry

## 2022-12-06 ENCOUNTER — Encounter (HOSPITAL_COMMUNITY): Payer: Self-pay | Admitting: Psychiatry

## 2022-12-06 VITALS — Wt 118.0 lb

## 2022-12-06 DIAGNOSIS — F419 Anxiety disorder, unspecified: Secondary | ICD-10-CM

## 2022-12-06 DIAGNOSIS — F330 Major depressive disorder, recurrent, mild: Secondary | ICD-10-CM | POA: Diagnosis not present

## 2022-12-06 MED ORDER — FLUOXETINE HCL 20 MG PO CAPS: 20.0000 mg | ORAL_CAPSULE | Freq: Every day | ORAL | 1 refills | Status: DC

## 2022-12-06 MED ORDER — FLUOXETINE HCL 20 MG PO CAPS
20.0000 mg | ORAL_CAPSULE | Freq: Every day | ORAL | 0 refills | Status: DC
  Filled 2022-12-06: qty 30, 30d supply, fill #0

## 2022-12-06 NOTE — Progress Notes (Signed)
Health MD Virtual Progress Note   Patient Location: Home Provider Location: Home Office  I connect with patient by video and verified that I am speaking with correct person by using two identifiers. I discussed the limitations of evaluation and management by telemedicine and the availability of in person appointments. I also discussed with the patient that there may be a patient responsible charge related to this service. The patient expressed understanding and agreed to proceed.  Donna Phillips [id number] 52 y.o.  12/06/2022 8:57 AM  History of Present Illness:  Patient is evaluated by video session.  She is taking Prozac 20 mg after Cymbalta started to have side effects.  She will also not able to lose weight and having headaches, nausea and constipation.  She is doing better with the Prozac.  She is tolerating well and reported no major concern for side effects.  She has no tremor or shakes or any EPS.  She reported her anxiety and depression is stable.  She sleeps good.  She lost 3 pounds since started the Prozac.  Her energy level is good.  Her 8 year old daughter is now in fifth grade.  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  She started school in August and hoping to finish in less than 2-year.  So far things are going well.  She had a busy summer and she took her daughter to [place] cruise and had a good time.  Her daughter is at [employer].  Patient denies any crying spells or any feeling of hopelessness or worthlessness.  Her appetite is okay.  She sleeps good.  Patient now also have benefits from [place] and she like to have the prescription sent to the [place] but not sure if she able to get the prescription on time.  Past Psychiatric History: H/O depression and anxiety.  Took Prozac from 2005-2013 until got pregnant.  Tried briefly Lexapro but causes side effects.  Wellbutrin caused tremors, headaches,  Zoloft caused dry mouth, weight gain and Cymbalta caused weight gain and GI side effects..  No h/o inpatient treatment, suicidal attempt or mania.     Outpatient Encounter Medications as of 12/06/2022  Medication Sig   DULoxetine (CYMBALTA) 20 MG capsule Take 1 capsule (20 mg total) by mouth daily.   FLUoxetine (PROZAC) 10 MG capsule Take 1 capsule (10 mg total) by mouth daily for 7 days, THEN 2 capsules (20 mg total) daily.   Multiple Vitamin (MULTIVITAMIN WITH MINERALS) TABS tablet Take 1 tablet by mouth daily.   norethindrone-ethinyl estradiol-iron (LARIN FE 1.5/30) 1.5-30 MG-MCG tablet Take 1 tablet by mouth daily.   verapamil (VERELAN) 120 MG 24 hr capsule Take 1 capsule (120 mg total) by mouth daily.   No facility-administered encounter medications on file as of 12/06/2022.    No results found for this or any previous visit (from the past 2160 hour(s)).   Psychiatric Specialty Exam: Physical Exam  Review of Systems  Weight 118 lb (53.5 kg).There is no height or weight on file to calculate BMI.  General Appearance: Casual  Eye Contact:  Good  Speech:  Clear and Coherent  Volume:  Normal  Mood:  Euthymic  Affect:  Appropriate  Thought Process:  Goal Directed  Orientation:  Full (Time, Place, and Person)  Thought Content:  Logical  Suicidal Thoughts:  No  Homicidal Thoughts:  No  Memory:  Immediate;   Good Recent;  Good Remote;   Good  Judgement:  Good  Insight:  Good  Psychomotor Activity:  Normal  Concentration:  Concentration: Good and Attention Span: Good  Recall:  Good  Fund of Knowledge:  Good  Language:  Good  Akathisia:  No  Handed:  Right  AIMS (if indicated):     Assets:  Communication Skills Desire for Improvement Housing Transportation  ADL's:  Intact  Cognition:  WNL  Sleep:  ok     Assessment/Plan: MDD (major depressive disorder), recurrent episode, mild ([facility]) - Plan: FLUoxetine (PROZAC) 20 MG capsule, DISCONTINUED: FLUoxetine (PROZAC) 20 MG  capsule  Anxiety - Plan: FLUoxetine (PROZAC) 20 MG capsule, DISCONTINUED: FLUoxetine (PROZAC) 20 MG capsule  Patient is stable on Prozac 20 mg after Cymbalta started to give her headaches, and GI side effects and she was not able to lose weight.  She is taking Prozac without any concerns or side effects.  She also started on line masters in teaching and that has been going well.  Her plan is to teach in the future.  She also have benefits from the [place] and like to send prescription but not sure if she will be able to get the prescription soon.  We will provide a bridge prescription to the [facility] and also send a new prescription to [place].  Continue Prozac 20 mg daily.  Discussed medication side effects and benefits.  Recommend to call us back if she has any question or any concern.  Follow-up in 6 months.  She is taking blood pressure medicine from her primary care physician Dr. Carylon Perches.     Follow Up Instructions:     I discussed the assessment and treatment plan with the patient. The patient was provided an opportunity to ask questions and all were answered. The patient agreed with the plan and demonstrated an understanding of the instructions.   The patient was advised to call back or seek an in-person evaluation if the symptoms worsen or if the condition fails to improve as anticipated.    Collaboration of Care: Other provider involved in patient's care AEB notes are available in epic to review.  Patient/Guardian was advised Release of Information must be obtained prior to any record release in order to collaborate their care with an outside provider. Patient/Guardian was advised if they have not already done so to contact the registration department to sign all necessary forms in order for [name] to release information regarding their care.   Consent: Patient/Guardian gives verbal consent for treatment and assignment of benefits for services provided during this visit. Patient/Guardian  expressed understanding and agreed to proceed.     I provided 22 minutes of non face to face time during this encounter.  Note: This document was prepared by [employer] voice dictation technology and any errors that results from this process are unintentional.    Cleotis Nipper, MD 12/06/2022

## 2022-12-10 ENCOUNTER — Other Ambulatory Visit: Payer: Self-pay

## 2022-12-10 ENCOUNTER — Other Ambulatory Visit (HOSPITAL_COMMUNITY): Payer: Self-pay | Admitting: *Deleted

## 2022-12-10 ENCOUNTER — Other Ambulatory Visit (HOSPITAL_COMMUNITY): Payer: Self-pay | Admitting: Psychiatry

## 2022-12-10 ENCOUNTER — Other Ambulatory Visit (HOSPITAL_COMMUNITY): Payer: Self-pay

## 2022-12-10 MED ORDER — FLUOXETINE HCL 20 MG PO CAPS
20.0000 mg | ORAL_CAPSULE | Freq: Every day | ORAL | 0 refills | Status: DC
  Filled 2022-12-10: qty 30, 30d supply, fill #0

## 2022-12-13 ENCOUNTER — Other Ambulatory Visit (HOSPITAL_COMMUNITY): Payer: Self-pay

## 2022-12-17 ENCOUNTER — Other Ambulatory Visit (HOSPITAL_COMMUNITY): Payer: Self-pay

## 2022-12-29 ENCOUNTER — Other Ambulatory Visit (HOSPITAL_COMMUNITY): Payer: Self-pay | Admitting: *Deleted

## 2022-12-29 DIAGNOSIS — F419 Anxiety disorder, unspecified: Secondary | ICD-10-CM

## 2022-12-29 DIAGNOSIS — F330 Major depressive disorder, recurrent, mild: Secondary | ICD-10-CM

## 2022-12-29 MED ORDER — FLUOXETINE HCL 20 MG PO CAPS: 20.0000 mg | ORAL_CAPSULE | Freq: Every day | ORAL | 1 refills | Status: DC

## 2023-01-02 ENCOUNTER — Encounter (HOSPITAL_COMMUNITY): Payer: Self-pay

## 2023-01-12 ENCOUNTER — Other Ambulatory Visit (HOSPITAL_COMMUNITY): Payer: Self-pay | Admitting: Internal Medicine

## 2023-01-12 DIAGNOSIS — Z1231 Encounter for screening mammogram for malignant neoplasm of breast: Secondary | ICD-10-CM

## 2023-01-25 ENCOUNTER — Other Ambulatory Visit (INDEPENDENT_AMBULATORY_CARE_PROVIDER_SITE_OTHER): Payer: 59 | Admitting: General Surgery

## 2023-01-25 DIAGNOSIS — R300 Dysuria: Secondary | ICD-10-CM

## 2023-01-25 MED ORDER — CIPROFLOXACIN HCL 500 MG PO TABS: 500.0000 mg | ORAL_TABLET | Freq: Two times a day (BID) | ORAL | 0 refills | Status: DC

## 2023-01-25 NOTE — Progress Notes (Signed)
Ciprofloxacin for UTI.

## 2023-02-07 ENCOUNTER — Ambulatory Visit (HOSPITAL_COMMUNITY)
Admission: RE | Admit: 2023-02-07 | Discharge: 2023-02-07 | Disposition: A | Discharge status: Home / Self Care | Source: Ambulatory Visit | Attending: Internal Medicine | Admitting: Internal Medicine

## 2023-02-07 ENCOUNTER — Encounter (HOSPITAL_COMMUNITY): Payer: Self-pay | Admitting: Radiology

## 2023-02-07 DIAGNOSIS — Z1231 Encounter for screening mammogram for malignant neoplasm of breast: Secondary | ICD-10-CM | POA: Insufficient documentation

## 2023-02-09 ENCOUNTER — Other Ambulatory Visit (HOSPITAL_COMMUNITY): Payer: Self-pay

## 2023-02-10 ENCOUNTER — Other Ambulatory Visit (HOSPITAL_COMMUNITY): Payer: Self-pay

## 2023-02-10 ENCOUNTER — Other Ambulatory Visit: Payer: Self-pay

## 2023-02-10 MED ORDER — NORETHIN ACE-ETH ESTRAD-FE 1.5-30 MG-MCG PO TABS
1.0000 | ORAL_TABLET | Freq: Every day | ORAL | 3 refills | Status: AC
Start: 2023-02-10 — End: ?
  Filled 2023-02-10: qty 84, 84d supply, fill #0
  Filled 2023-05-11: qty 84, 84d supply, fill #1
  Filled 2023-07-29: qty 84, 84d supply, fill #2
  Filled 2023-10-21: qty 84, 84d supply, fill #3

## 2023-02-15 ENCOUNTER — Other Ambulatory Visit (HOSPITAL_COMMUNITY): Payer: Self-pay

## 2023-03-08 ENCOUNTER — Other Ambulatory Visit: Payer: Self-pay | Admitting: General Surgery

## 2023-03-08 ENCOUNTER — Telehealth: Admitting: Physician Assistant

## 2023-03-08 DIAGNOSIS — J019 Acute sinusitis, unspecified: Secondary | ICD-10-CM

## 2023-03-08 DIAGNOSIS — B9689 Other specified bacterial agents as the cause of diseases classified elsewhere: Secondary | ICD-10-CM

## 2023-03-08 MED ORDER — AZITHROMYCIN 250 MG PO TABS: ORAL_TABLET | ORAL | 0 refills | Status: AC

## 2023-03-08 NOTE — Progress Notes (Unsigned)
 Zpac for sinusitis.

## 2023-03-08 NOTE — Progress Notes (Signed)

## 2023-05-11 ENCOUNTER — Other Ambulatory Visit: Payer: Self-pay

## 2023-06-06 ENCOUNTER — Encounter (HOSPITAL_COMMUNITY): Payer: Self-pay | Admitting: Psychiatry

## 2023-06-06 ENCOUNTER — Telehealth (HOSPITAL_BASED_OUTPATIENT_CLINIC_OR_DEPARTMENT_OTHER): Admitting: Psychiatry

## 2023-06-06 DIAGNOSIS — F419 Anxiety disorder, unspecified: Secondary | ICD-10-CM | POA: Diagnosis not present

## 2023-06-06 DIAGNOSIS — F330 Major depressive disorder, recurrent, mild: Secondary | ICD-10-CM

## 2023-06-06 MED ORDER — FLUOXETINE HCL 20 MG PO CAPS: 20.0000 mg | ORAL_CAPSULE | Freq: Every day | ORAL | 1 refills | Status: DC

## 2023-06-06 NOTE — Progress Notes (Signed)
 [facility] MD Virtual Progress Note   Patient Location: Home Provider Location: Home Office  I connect with patient by video and verified that I am speaking with correct person by using two identifiers. I discussed the limitations of evaluation and management by telemedicine and the availability of in person appointments. I also discussed with the patient that there may be a patient responsible charge related to this service. The patient expressed understanding and agreed to proceed.  Donna Phillips [id number] 53 y.o.  06/06/2023 9:01 AM  History of Present Illness:  Patient is evaluated by video session.  She is doing well on Prozac 20 mg daily.  Her appetite is okay.  Her weight is stable.  She admitted some anxiety as taking classes but she is also very happy is getting a grades in her classes.  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  She denies any panic attack, crying spells, feeling of hopelessness or worthlessness.  Her 17 year old daughter attends Agricultural engineer and fifth grade.  She denies any hopelessness or worthlessness.  She denies any suicidal thoughts.  She has no tremor or shakes or any EPS.  She wants to continue current dose of Prozac 20 mg daily like her medication sent to [facility].  No new medication added from primary care.  Past Psychiatric History: H/O depression and anxiety.  Took Prozac from 2005-2013 until got pregnant.  Tried briefly Lexapro but causes side effects.  Wellbutrin caused tremors, headaches, Zoloft caused dry mouth, weight gain and Cymbalta caused weight gain and GI side effects..  No h/o inpatient treatment, suicidal attempt or mania.     Outpatient Encounter Medications as of 06/06/2023  Medication Sig   ciprofloxacin (CIPRO) 500 MG tablet Take 1 tablet (500 mg total) by mouth 2 (two) times daily.   FLUoxetine (PROZAC) 20 MG capsule Take 1 capsule (20 mg  total) by mouth daily.   Multiple Vitamin (MULTIVITAMIN WITH MINERALS) TABS tablet Take 1 tablet by mouth daily.   norethindrone-ethinyl estradiol-iron (LARIN FE 1.5/30) 1.5-30 MG-MCG tablet Take 1 tablet by mouth daily.   verapamil (VERELAN) 120 MG 24 hr capsule Take 1 capsule (120 mg total) by mouth daily.   No facility-administered encounter medications on file as of 06/06/2023.    No results found for this or any previous visit (from the past 2160 hours).   Psychiatric Specialty Exam: Physical Exam  Review of Systems  Weight 118 lb (53.5 kg).There is no height or weight on file to calculate BMI.  General Appearance: Casual  Eye Contact:  Good  Speech:  Clear and Coherent  Volume:  Normal  Mood:  Euthymic  Affect:  Congruent  Thought Process:  Goal Directed  Orientation:  Full (Time, Place, and Person)  Thought Content:  Logical  Suicidal Thoughts:  No  Homicidal Thoughts:  No  Memory:  Immediate;   Good Recent;   Good Remote;   Good  Judgement:  Good  Insight:  Present  Psychomotor Activity:  Normal  Concentration:  Concentration: Good and Attention Span: Good  Recall:  Good  Fund of Knowledge:  Good  Language:  Good  Akathisia:  No  Handed:  Right  AIMS (if indicated):     Assets:  Communication Skills Desire for Improvement Financial Resources/Insurance Housing Social Support Talents/Skills Transportation  ADL's:  Intact  Cognition:  WNL  Sleep:  ok     Assessment/Plan: Anxiety -  Plan: FLUoxetine (PROZAC) 20 MG capsule  MDD (major depressive disorder), recurrent episode, mild (HCC) - Plan: FLUoxetine (PROZAC) 20 MG capsule  Patient doing well on Prozac 20 mg daily.  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  Discussed medication side effects and benefits.  She has no major concern.  She like to send her prescription to [facility] as patient is known.  [facility].  Recommended to call us back if you have any question or  any concern.  Follow-up in 6 months.   Follow Up Instructions:     I discussed the assessment and treatment plan with the patient. The patient was provided an opportunity to ask questions and all were answered. The patient agreed with the plan and demonstrated an understanding of the instructions.   The patient was advised to call back or seek an in-person evaluation if the symptoms worsen or if the condition fails to improve as anticipated.    Collaboration of Care: Other provider involved in patient's care AEB notes are available in epic to review  Patient/Guardian was advised Release of Information must be obtained prior to any record release in order to collaborate their care with an outside provider. Patient/Guardian was advised if they have not already done so to contact the registration department to sign all necessary forms in order for [name] to release information regarding their care.   Consent: Patient/Guardian gives verbal consent for treatment and assignment of benefits for services provided during this visit. Patient/Guardian expressed understanding and agreed to proceed.     I provided 17 minutes of non face to face time during this encounter.  Note: This document was prepared by [employer] voice dictation technology and any errors that results from this process are unintentional.    Cleotis Nipper, MD 06/06/2023

## 2023-07-29 ENCOUNTER — Other Ambulatory Visit (HOSPITAL_COMMUNITY): Payer: Self-pay

## 2023-08-10 ENCOUNTER — Other Ambulatory Visit (HOSPITAL_COMMUNITY): Payer: Self-pay

## 2023-08-10 MED ORDER — NORETHIN ACE-ETH ESTRAD-FE 1.5-30 MG-MCG PO TABS
1.0000 | ORAL_TABLET | Freq: Every day | ORAL | 3 refills | Status: AC
  Filled 2023-08-10 – 2024-01-14 (×2): qty 84, 84d supply, fill #0
  Filled 2024-04-09: qty 84, 84d supply, fill #1

## 2023-10-21 ENCOUNTER — Other Ambulatory Visit (HOSPITAL_COMMUNITY): Payer: Self-pay

## 2023-12-07 ENCOUNTER — Telehealth (HOSPITAL_BASED_OUTPATIENT_CLINIC_OR_DEPARTMENT_OTHER): Admitting: Psychiatry

## 2023-12-07 ENCOUNTER — Encounter (HOSPITAL_COMMUNITY): Payer: Self-pay | Admitting: Psychiatry

## 2023-12-07 VITALS — Wt 124.0 lb

## 2023-12-07 DIAGNOSIS — F330 Major depressive disorder, recurrent, mild: Secondary | ICD-10-CM

## 2023-12-07 DIAGNOSIS — F419 Anxiety disorder, unspecified: Secondary | ICD-10-CM | POA: Diagnosis not present

## 2023-12-07 MED ORDER — FLUOXETINE HCL 10 MG PO CAPS: 30.0000 mg | ORAL_CAPSULE | Freq: Every day | ORAL | 1 refills | Status: DC

## 2023-12-07 NOTE — Progress Notes (Signed)
 [facility] MD Virtual Progress Note   Patient Location: Home Provider Location: Home office  I connect with patient by video and verified that I am speaking with correct person by using two identifiers. I discussed the limitations of evaluation and management by telemedicine and the availability of in person appointments. I also discussed with the patient that there may be a patient responsible charge related to this service. The patient expressed understanding and agreed to proceed.  Donna Phillips [id number] 53 y.o.  12/07/2023 9:39 AM  History of Present Illness:  Patient is evaluated by video session.  She reported a lot of stress and anxiety lately because of the school.  She has a hard time passing the test.  She took twice math test and feel and now she is taking third time in few weeks.  Patient told the classes are very difficult and hard and she feels embarrassed that she is not able to pass through.  She also feels guilty and depressed because not able to spend time with her 92 year old daughter 69.  She reported sitting in front of computer all day and reported weight gain because she is not paying attention and giving time to herself.  She reported poor sleep and there are days when she has crying spells and racing thoughts.  Patient told she has to pass the test so she can get the internship.  Patient told she like her teacher but content of the exam and submit is very difficult and she has difficulty getting into it.  She has no tremors or shakes or any EPS.  She is disappointed but denies any hopelessness, worthlessness or suicidal thoughts.  Her husband is very supportive.  She denies drinking or using any illegal substances.  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  But she denies any panic attack but feels very nervous, anxious and tearfulness.  She is taking Prozac  every day.  No new medication added from  primary care.  Past Psychiatric History: H/O depression and anxiety.  Took Prozac  from 2005-2013 until got pregnant.  Tried briefly Lexapro but causes side effects.  Wellbutrin  caused tremors, headaches, Zoloft  caused dry mouth, weight gain and Cymbalta  caused weight gain and GI side effects..  No h/o inpatient treatment, suicidal attempt or mania.    Past Medical History:  Diagnosis Date   Abnormal Pap smear    AMA (advanced maternal age) multigravida 35+    Anxiety    Depression    H/O varicella    Headache    HSV (herpes simplex virus) anogenital infection    Hypertension    PONV (postoperative nausea and vomiting)     Outpatient Encounter Medications as of 12/07/2023  Medication Sig   FLUoxetine  (PROZAC ) 20 MG capsule Take 1 capsule (20 mg total) by mouth daily.   Multiple Vitamin (MULTIVITAMIN WITH MINERALS) TABS tablet Take 1 tablet by mouth daily.   norethindrone -ethinyl estradiol -iron (LARIN  FE 1.5/30) 1.5-30 MG-MCG tablet Take 1 tablet by mouth daily.   norethindrone -ethinyl estradiol -iron (LARIN  FE 1.5/30) 1.5-30 MG-MCG tablet Take 1 tablet by mouth daily.   verapamil  (VERELAN ) 120 MG 24 hr capsule Take 1 capsule (120 mg total) by mouth daily.   No facility-administered encounter medications on file as of 12/07/2023.    No results found for this or any previous visit (from the past 2160 hours).   Psychiatric Specialty Exam: Physical Exam  Review of Systems  Psychiatric/Behavioral:  Positive for dysphoric mood and sleep disturbance. The patient is nervous/anxious.     Weight 124 lb (56.2 kg).There is no height or weight on file to calculate BMI.  General Appearance: Casual  Eye Contact:  Good  Speech:  Clear and Coherent  Volume:  Decreased  Mood:  Dysphoric  Affect:  Appropriate  Thought Process:  Goal Directed  Orientation:  Full (Time, Place, and Person)  Thought Content:  Logical  Suicidal Thoughts:  No  Homicidal Thoughts:  No  Memory:  Immediate;    Good Recent;   Good Remote;   Good  Judgement:  Intact  Insight:  Present  Psychomotor Activity:  Normal  Concentration:  Concentration: Good and Attention Span: Good  Recall:  Good  Fund of Knowledge:  Good  Language:  Good  Akathisia:  No  Handed:  Right  AIMS (if indicated):     Assets:  Communication Skills Desire for Improvement Housing Social Support Talents/Skills Transportation  ADL's:  Intact  Cognition:  WNL  Sleep:  fair       06/02/2020    8:57 AM  Depression screen PHQ 2/9  Decreased Interest 0  Down, Depressed, Hopeless 0  PHQ - 2 Score 0    Assessment/Plan: MDD (major depressive disorder), recurrent episode, mild (HCC) - Plan: FLUoxetine  (PROZAC ) 10 MG capsule  Anxiety - Plan: FLUoxetine  (PROZAC ) 10 MG capsule  Patient is 53 year old Caucasian female with history of depression, anxiety.  Discussed psychosocial stressors especially school as she has a hard time passing the exams.  She is frustrated, disappointed.  She reported not spending time with her daughter and she feels guilty about it.  Discussed optimize Prozac  to help her crying spells and depressive thoughts.  Discussed to use other resources to help her standing skills.  Consider one-to-one tutoring which she is now thinking about it.  She also gained weight because she is sitting on the computer and not taking care of self.  Encourage walking at least 15 minutes 2-3 times a week with her daughter and to help her coping skills.  Discussed her anxiety and depression could be situational.  However she agreed to try higher dose of Prozac .  So far no major concern or side effects.  Will try Prozac  30 mg daily.  Discussed medication side effects and benefits.  Recommend to call back if she is any question or any concern.  Follow-up in 2 months.   Follow Up Instructions:     I discussed the assessment and treatment plan with the patient. The patient was provided an opportunity to ask questions and all were  answered. The patient agreed with the plan and demonstrated an understanding of the instructions.   The patient was advised to call back or seek an in-person evaluation if the symptoms worsen or if the condition fails to improve as anticipated.    Collaboration of Care: Other provider involved in patient's care AEB reviewed notes, recommend to see the primary care on a regular basis.  Notes are available in epic.  Patient/Guardian was advised Release of Information must be obtained prior to any record release in order to collaborate their care with an outside provider. Patient/Guardian was advised if they have not already done so to contact the registration department to sign all necessary forms in order for us  to release information regarding their care.   Consent: Patient/Guardian gives verbal consent for treatment and assignment of benefits for services provided during this visit. Patient/Guardian expressed understanding and agreed to  proceed.     Total encounter time 25 minutes which includes face-to-face time, chart reviewed, care coordination, order entry and documentation during this encounter.   Note: This document was prepared by [employer] voice dictation technology and any errors that results from this process are unintentional.    Leni ONEIDA Client, MD 12/07/2023

## 2024-01-14 ENCOUNTER — Other Ambulatory Visit (HOSPITAL_COMMUNITY): Payer: Self-pay

## 2024-01-16 ENCOUNTER — Other Ambulatory Visit (HOSPITAL_COMMUNITY): Payer: Self-pay

## 2024-02-07 ENCOUNTER — Telehealth (HOSPITAL_COMMUNITY): Admitting: Psychiatry

## 2024-02-07 ENCOUNTER — Encounter (HOSPITAL_COMMUNITY): Payer: Self-pay | Admitting: Psychiatry

## 2024-02-07 DIAGNOSIS — F330 Major depressive disorder, recurrent, mild: Secondary | ICD-10-CM

## 2024-02-07 DIAGNOSIS — F419 Anxiety disorder, unspecified: Secondary | ICD-10-CM | POA: Diagnosis not present

## 2024-02-07 MED ORDER — FLUOXETINE HCL 10 MG PO CAPS: 30.0000 mg | ORAL_CAPSULE | Freq: Every day | ORAL | 2 refills | Status: AC

## 2024-02-07 NOTE — Progress Notes (Signed)
 [facility] MD Virtual Progress Note   Patient Location: Home Provider Location: Home Office  I connect with patient by video and verified that I am speaking with correct person by using two identifiers. I discussed the limitations of evaluation and management by telemedicine and the availability of in person appointments. I also discussed with the patient that there may be a patient responsible charge related to this service. The patient expressed understanding and agreed to proceed.  Donna Phillips [id number] 53 y.o.  02/07/2024 10:23 AM  History of Present Illness:  Patient is evaluated by video session.  On the last visit we increased Prozac  and she is taking 30 mg.  She noticed much improvement in her anxiety focus attention and able to pass the exam with A..  She is now enjoying school and making good efforts in her new class.  Patient is doing online study from [employer] and education.  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  She is sleeping good.  She is tolerating Prozac  30 mg and denies any tremors, shakes or any EPS.  She denies any suicidal thoughts, panic attack or any hopelessness.  She is motivated and more active.  Though she did not lost weight but feel her energy level is improved.  She started exercise.  She does not want to change the medication.  She had a good support from her husband.  Patient denies any hallucination, paranoia or any irritability.  Her appetite is okay.  Past Psychiatric History: H/O depression and anxiety.  Took Prozac  from 2005-2013 until got pregnant.  Tried briefly Lexapro but causes side effects.  Wellbutrin  caused tremors, headaches, Zoloft  caused dry mouth, weight gain and Cymbalta  caused weight gain and GI side effects..  No h/o inpatient treatment, suicidal attempt or mania.    Past Medical History:  Diagnosis Date   Abnormal Pap smear    AMA (advanced maternal age) multigravida 35+    Anxiety    Depression    H/O varicella     Headache    HSV (herpes simplex virus) anogenital infection    Hypertension    PONV (postoperative nausea and vomiting)     Outpatient Encounter Medications as of 02/07/2024  Medication Sig   FLUoxetine  (PROZAC ) 10 MG capsule Take 3 capsules (30 mg total) by mouth daily.   Multiple Vitamin (MULTIVITAMIN WITH MINERALS) TABS tablet Take 1 tablet by mouth daily.   norethindrone -ethinyl estradiol -iron (LARIN  FE 1.5/30) 1.5-30 MG-MCG tablet Take 1 tablet by mouth daily.   norethindrone -ethinyl estradiol -iron (LARIN  FE 1.5/30) 1.5-30 MG-MCG tablet Take 1 tablet by mouth daily.   verapamil  (VERELAN ) 120 MG 24 hr capsule Take 1 capsule (120 mg total) by mouth daily.   No facility-administered encounter medications on file as of 02/07/2024.    No results found for this or any previous visit (from the past 2160 hours).   Psychiatric Specialty Exam: Physical Exam  Review of Systems  Weight 124 lb (56.2 kg).There is no height or weight on file to calculate BMI.  General Appearance: Casual and pleasant   Eye Contact:  Good  Speech:  Clear and Coherent  Volume:  Normal  Mood:  Euthymic  Affect:  Appropriate  Thought Process:  Goal Directed  Orientation:  Full (Time, Place, and Person)  Thought Content:  Logical  Suicidal Thoughts:  No  Homicidal Thoughts:  No  Memory:  Immediate;   Good Recent;   Good Remote;   Good  Judgement:  Good  Insight:  Good  Psychomotor Activity:  Normal  Concentration:  Concentration: Good and Attention Span: Good  Recall:  Good  Fund of Knowledge:  Good  Language:  Good  Akathisia:  No  Handed:  Right  AIMS (if indicated):     Assets:  Communication Skills Desire for Improvement Housing Resilience Social Support Talents/Skills Transportation  ADL's:  Intact  Cognition:  WNL  Sleep:  ok       06/02/2020    8:57 AM  Depression screen PHQ 2/9  Decreased Interest 0  Down, Depressed, Hopeless 0  PHQ - 2 Score 0     Assessment/Plan: Anxiety - Plan: FLUoxetine  (PROZAC ) 10 MG capsule  MDD (major depressive disorder), recurrent episode, mild - Plan: FLUoxetine  (PROZAC ) 10 MG capsule  Patient is 53 year old Caucasian female with history of depression and anxiety.  We increased Prozac  to 30 mg on the last visit and she is tolerating very well.  She is making good grades in the school.  Discussed medication side effects and benefits.  So far no major concern from the medication.  Continue Prozac  30 mg daily.  Patient like to have her prescription sent to [facility].  Recommend to call back if she has any question or any concern.  Will follow-up in 3 months.   Follow Up Instructions:     I discussed the assessment and treatment plan with the patient. The patient was provided an opportunity to ask questions and all were answered. The patient agreed with the plan and demonstrated an understanding of the instructions.   The patient was advised to call back or seek an in-person evaluation if the symptoms worsen or if the condition fails to improve as anticipated.    Collaboration of Care: Other provider involved in patient's care AEB notes are available in epic to review  Patient/Guardian was advised Release of Information must be obtained prior to any record release in order to collaborate their care with an outside provider. Patient/Guardian was advised if they have not already done so to contact the registration department to sign all necessary forms in order for us  to release information regarding their care.   Consent: Patient/Guardian gives verbal consent for treatment and assignment of benefits for services provided during this visit. Patient/Guardian expressed understanding and agreed to proceed.     Total encounter time 15 minutes which includes face-to-face time, chart reviewed, care coordination, order entry and documentation during this encounter.   Note: This document was prepared by [employer] voice  dictation technology and any errors that results from this process are unintentional.    Leni ONEIDA Client, MD 02/07/2024

## 2024-02-21 ENCOUNTER — Other Ambulatory Visit (HOSPITAL_COMMUNITY): Payer: Self-pay | Admitting: Internal Medicine

## 2024-02-21 DIAGNOSIS — Z1231 Encounter for screening mammogram for malignant neoplasm of breast: Secondary | ICD-10-CM

## 2024-03-01 ENCOUNTER — Ambulatory Visit (HOSPITAL_COMMUNITY)

## 2024-03-07 ENCOUNTER — Ambulatory Visit (HOSPITAL_COMMUNITY)
Admission: RE | Admit: 2024-03-07 | Discharge: 2024-03-07 | Disposition: A | Discharge status: Home / Self Care | Source: Ambulatory Visit | Attending: Internal Medicine | Admitting: Internal Medicine

## 2024-03-07 DIAGNOSIS — Z1231 Encounter for screening mammogram for malignant neoplasm of breast: Secondary | ICD-10-CM | POA: Diagnosis present

## 2024-04-10 ENCOUNTER — Other Ambulatory Visit (HOSPITAL_COMMUNITY): Payer: Self-pay

## 2024-05-09 ENCOUNTER — Telehealth (HOSPITAL_COMMUNITY): Admitting: Psychiatry
